# Patient Record
Sex: Male | Born: 1970 | Race: Black or African American | Hispanic: No | Marital: Married | Smoking: Never smoker
Health system: Southern US, Community
[De-identification: ages and names within clinical notes are randomized; demographics above are authoritative.]

## PROBLEM LIST (undated history)

## (undated) DIAGNOSIS — K219 Gastro-esophageal reflux disease without esophagitis: Secondary | ICD-10-CM

## (undated) DIAGNOSIS — L259 Unspecified contact dermatitis, unspecified cause: Secondary | ICD-10-CM

## (undated) DIAGNOSIS — I10 Essential (primary) hypertension: Secondary | ICD-10-CM

## (undated) DIAGNOSIS — J069 Acute upper respiratory infection, unspecified: Secondary | ICD-10-CM

## (undated) DIAGNOSIS — G4730 Sleep apnea, unspecified: Secondary | ICD-10-CM

## (undated) DIAGNOSIS — F419 Anxiety disorder, unspecified: Secondary | ICD-10-CM

## (undated) DIAGNOSIS — D509 Iron deficiency anemia, unspecified: Secondary | ICD-10-CM

## (undated) DIAGNOSIS — K5090 Crohn's disease, unspecified, without complications: Secondary | ICD-10-CM

## (undated) DIAGNOSIS — J302 Other seasonal allergic rhinitis: Secondary | ICD-10-CM

## (undated) DIAGNOSIS — K432 Incisional hernia without obstruction or gangrene: Secondary | ICD-10-CM

## (undated) DIAGNOSIS — J309 Allergic rhinitis, unspecified: Secondary | ICD-10-CM

## (undated) DIAGNOSIS — Z87442 Personal history of urinary calculi: Secondary | ICD-10-CM

## (undated) HISTORY — DX: Allergic rhinitis, unspecified: J30.9

## (undated) HISTORY — DX: Other seasonal allergic rhinitis: J30.2

## (undated) HISTORY — DX: Gastro-esophageal reflux disease without esophagitis: K21.9

## (undated) HISTORY — DX: Unspecified contact dermatitis, unspecified cause: L25.9

## (undated) HISTORY — DX: Incisional hernia without obstruction or gangrene: K43.2

## (undated) HISTORY — DX: Acute upper respiratory infection, unspecified: J06.9

## (undated) HISTORY — DX: Anxiety disorder, unspecified: F41.9

## (undated) HISTORY — DX: Sleep apnea, unspecified: G47.30

## (undated) HISTORY — DX: Essential (primary) hypertension: I10

## (undated) HISTORY — PX: COLONOSCOPY: SHX174

## (undated) HISTORY — DX: Iron deficiency anemia, unspecified: D50.9

## (undated) HISTORY — DX: Crohn's disease, unspecified, without complications: K50.90

---

## 1996-03-09 HISTORY — PX: OTHER SURGICAL HISTORY: SHX169

## 2004-01-18 ENCOUNTER — Ambulatory Visit: Payer: Self-pay | Admitting: Internal Medicine

## 2007-01-04 ENCOUNTER — Encounter: Payer: Self-pay | Admitting: *Deleted

## 2007-01-04 DIAGNOSIS — I10 Essential (primary) hypertension: Secondary | ICD-10-CM

## 2007-01-04 DIAGNOSIS — K5090 Crohn's disease, unspecified, without complications: Secondary | ICD-10-CM

## 2007-01-04 HISTORY — DX: Essential (primary) hypertension: I10

## 2007-01-04 HISTORY — DX: Crohn's disease, unspecified, without complications: K50.90

## 2007-03-18 ENCOUNTER — Ambulatory Visit: Payer: Self-pay | Admitting: Internal Medicine

## 2007-03-18 DIAGNOSIS — J309 Allergic rhinitis, unspecified: Secondary | ICD-10-CM

## 2007-03-18 DIAGNOSIS — D509 Iron deficiency anemia, unspecified: Secondary | ICD-10-CM

## 2007-03-18 DIAGNOSIS — J302 Other seasonal allergic rhinitis: Secondary | ICD-10-CM | POA: Insufficient documentation

## 2007-03-18 HISTORY — DX: Allergic rhinitis, unspecified: J30.9

## 2007-03-18 HISTORY — DX: Iron deficiency anemia, unspecified: D50.9

## 2007-03-20 LAB — CONVERTED CEMR LAB
AST: 22 units/L (ref 0–37)
Albumin: 3.6 g/dL (ref 3.5–5.2)
Alkaline Phosphatase: 60 units/L (ref 39–117)
BUN: 10 mg/dL (ref 6–23)
Bacteria, UA: NEGATIVE
Basophils Relative: 0.7 % (ref 0.0–1.0)
Calcium: 9.1 mg/dL (ref 8.4–10.5)
Chloride: 104 meq/L (ref 96–112)
Creatinine, Ser: 1.1 mg/dL (ref 0.4–1.5)
Crystals: NEGATIVE
Eosinophils Relative: 1.6 % (ref 0.0–5.0)
Folate: 10.4 ng/mL
GFR calc non Af Amer: 81 mL/min
Lymphocytes Relative: 26.1 % (ref 12.0–46.0)
Mucus, UA: NEGATIVE
Neutro Abs: 5 10*3/uL (ref 1.4–7.7)
Nitrite: NEGATIVE
Platelets: 306 10*3/uL (ref 150–400)
RBC / HPF: NONE SEEN
RBC: 5.24 M/uL (ref 4.22–5.81)
RDW: 14.9 % — ABNORMAL HIGH (ref 11.5–14.6)
Specific Gravity, Urine: 1.02 (ref 1.000–1.03)
Squamous Epithelial / LPF: NEGATIVE /lpf
TSH: 0.8 microintl units/mL (ref 0.35–5.50)
Total Bilirubin: 0.7 mg/dL (ref 0.3–1.2)
Total CHOL/HDL Ratio: 3.7
Total Protein, Urine: NEGATIVE mg/dL
Urine Glucose: NEGATIVE mg/dL
WBC: 8 10*3/uL (ref 4.5–10.5)

## 2007-04-22 ENCOUNTER — Ambulatory Visit: Payer: Self-pay | Admitting: Internal Medicine

## 2007-05-20 ENCOUNTER — Ambulatory Visit: Payer: Self-pay | Admitting: Internal Medicine

## 2007-06-24 ENCOUNTER — Ambulatory Visit: Payer: Self-pay | Admitting: Internal Medicine

## 2007-07-22 ENCOUNTER — Ambulatory Visit: Payer: Self-pay | Admitting: Internal Medicine

## 2007-08-19 ENCOUNTER — Ambulatory Visit: Payer: Self-pay | Admitting: Internal Medicine

## 2007-12-09 ENCOUNTER — Ambulatory Visit: Payer: Self-pay | Admitting: Internal Medicine

## 2007-12-09 DIAGNOSIS — L219 Seborrheic dermatitis, unspecified: Secondary | ICD-10-CM | POA: Insufficient documentation

## 2007-12-09 DIAGNOSIS — J069 Acute upper respiratory infection, unspecified: Secondary | ICD-10-CM | POA: Insufficient documentation

## 2007-12-09 DIAGNOSIS — L259 Unspecified contact dermatitis, unspecified cause: Secondary | ICD-10-CM

## 2007-12-09 HISTORY — DX: Acute upper respiratory infection, unspecified: J06.9

## 2007-12-09 HISTORY — DX: Unspecified contact dermatitis, unspecified cause: L25.9

## 2008-01-13 ENCOUNTER — Ambulatory Visit: Payer: Self-pay | Admitting: Internal Medicine

## 2009-05-13 ENCOUNTER — Ambulatory Visit: Payer: Self-pay | Admitting: Internal Medicine

## 2010-04-10 NOTE — Assessment & Plan Note (Signed)
Summary: SORE THROAT/NWS  #   Vital Signs:  Patient profile:   40 year old male Height:      64 inches Weight:      159.50 pounds BMI:     27.48 O2 Sat:      99 % on Room air Temp:     97.4 degrees F oral Pulse rate:   95 / minute BP sitting:   152 / 92  (left arm) Cuff size:   regular  Vitals Entered ByZella Ball Ewing (May 13, 2009 11:31 AM)  O2 Flow:  Room air CC: Sore throat/RE   CC:  Sore throat/RE.  History of Present Illness: here with severe ST for 3 days, with fever, weakness, headache, myalgias and malaise;  no signficant cough and Pt denies CP, sob, doe, wheezing, orthopnea, pnd, worsening LE edema, palps, dizziness or syncope   Also would like to consider change of the lotrel as taking it seems to cause him to become in a "daze."  Pt denies CP, sob, doe, wheezing, orthopnea, pnd, worsening LE edema, palps, dizziness or syncope  Pt denies new neuro symptoms such as headache, facial or extremity weakness   Overall good compliance with meds.    Problems Prior to Update: 1)  Pharyngitis-acute  (ICD-462) 2)  Dermatitis, Scalp  (ICD-692.9) 3)  Uri  (ICD-465.9) 4)  Preventive Health Care  (ICD-V70.0) 5)  Allergic Rhinitis  (ICD-477.9) 6)  Anemia-iron Deficiency  (ICD-280.9) 7)  Hypertension  (ICD-401.9) 8)  Crohn's Disease  (ICD-555.9)  Medications Prior to Update: 1)  Cyanocobalamin 1000 Mcg/ml Inj Soln (Cyanocobalamin) .Marland Kitchen.. 1 Injection Q Mon. 2)  Amlodipine Besy-Benazepril Hcl 5-20 Mg Caps (Amlodipine Besy-Benazepril Hcl) .Marland Kitchen.. 1 By Mouth Once Daily  Current Medications (verified): 1)  Cyanocobalamin 1000 Mcg/ml Inj Soln (Cyanocobalamin) .Marland Kitchen.. 1 Injection Q Mon. 2)  Losartan Potassium 100 Mg Tabs (Losartan Potassium) .Marland Kitchen.. 1 By Mouth Once Daily 3)  Clarithromycin 500 Mg Tabs (Clarithromycin) .Marland Kitchen.. 1po Two Times A Day  Allergies (verified): 1)  ! Zantac  Past History:  Past Medical History: Last updated: 03/18/2007 HYPERTENSION (ICD-401.9) CROHN'S DISEASE  (ICD-555.9) Anemia-iron deficiency B12 deficiency Allergic rhinitis  Past Surgical History: Last updated: 03/18/2007 partial colon removal/1998 - right hemicolectomy  Social History: Last updated: 03/18/2007 Never Smoked Alcohol use-yes 1 child work -admin asst in [place]  Risk Factors: Smoking Status: never (03/18/2007)  Review of Systems       all otherwise negative per pt -    Physical Exam  General:  alert and well-developed.  , mild ill  Head:  normocephalic and atraumatic.   Eyes:  vision grossly intact, pupils equal, and pupils round.   Ears:  bilat tm;s red, sinus nontender Nose:  nasal dischargemucosal pallor and mucosal edema.   Mouth:  pharyngeal erythema and fair dentition.   Neck:  supple and cervical lymphadenopathy.   Lungs:  normal respiratory effort and normal breath sounds.   Heart:  normal rate and regular rhythm.   Extremities:  no edema, no erythema    Impression & Recommendations:  Problem # 1:  PHARYNGITIS-ACUTE (ICD-462)  His updated medication list for this problem includes:    Clarithromycin 500 Mg Tabs (Clarithromycin) .Marland Kitchen... 1po two times a day treat as above, f/u any worsening signs or symptoms   Problem # 2:  HYPERTENSION (ICD-401.9)  His updated medication list for this problem includes:    Losartan Potassium 100 Mg Tabs (Losartan potassium) .Marland Kitchen... 1 by mouth once daily states the generic lotrel  makes him feel 'dazed" but hard to be more specific; will change to generic cozaar; f/u next visit  Complete Medication List: 1)  Cyanocobalamin 1000 Mcg/ml Inj Soln (Cyanocobalamin) .Marland Kitchen.. 1 injection q mon. 2)  Losartan Potassium 100 Mg Tabs (Losartan potassium) .Marland Kitchen.. 1 by mouth once daily 3)  Clarithromycin 500 Mg Tabs (Clarithromycin) .Marland Kitchen.. 1po two times a day  Patient Instructions: 1)  stop the amlodipine/benazepril 2)  start the generic cozaar (losartan) 10p0 mg per day for blood pressure 3)  also Please take all new medications as  prescribed   - the antibiotic 4)  Please schedule a follow-up appointment in 2 months with CPX labs Prescriptions: CLARITHROMYCIN 500 MG TABS (CLARITHROMYCIN) 1po two times a day  #20 x 0   Entered and Authorized by:   Corwin Levins MD   Signed by:   Corwin Levins MD on 05/13/2009   Method used:   Print then Give to Patient   RxID:   [id number] LOSARTAN POTASSIUM 100 MG TABS (LOSARTAN POTASSIUM) 1 by mouth once daily  #30 x 11   Entered and Authorized by:   Corwin Levins MD   Signed by:   Corwin Levins MD on 05/13/2009   Method used:   Print then Give to Patient   RxID:   [id number]

## 2010-04-10 NOTE — Letter (Signed)
Summary: Out of Work  [facility]  [address] [place], [place] [address]   Phone: [phone]  Fax: [phone]    May 13, 2009   Employee:  FORRESTER BLANDO    To Whom It May Concern:   For Medical reasons, please excuse the above named employee from work for the following dates:  Start:   May 09, 2009  End:   May 14, 2009  -    to return to work May 15, 2009   If you need additional information, please feel free to contact our office.         Sincerely,    Corwin Levins MD

## 2010-06-11 ENCOUNTER — Other Ambulatory Visit: Payer: Self-pay

## 2010-06-16 ENCOUNTER — Other Ambulatory Visit (INDEPENDENT_AMBULATORY_CARE_PROVIDER_SITE_OTHER): Payer: BLUE CROSS/BLUE SHIELD

## 2010-06-16 DIAGNOSIS — Z0000 Encounter for general adult medical examination without abnormal findings: Secondary | ICD-10-CM

## 2010-06-16 LAB — BASIC METABOLIC PANEL
BUN: 11 mg/dL (ref 6–23)
Chloride: 101 mEq/L (ref 96–112)
Potassium: 4.6 mEq/L (ref 3.5–5.1)

## 2010-06-16 LAB — CBC WITH DIFFERENTIAL/PLATELET
Basophils Relative: 0.6 % (ref 0.0–3.0)
Eosinophils Relative: 4.7 % (ref 0.0–5.0)
HCT: 38.5 % — ABNORMAL LOW (ref 39.0–52.0)
MCV: 70 fl — ABNORMAL LOW (ref 78.0–100.0)
Monocytes Absolute: 0.7 10*3/uL (ref 0.1–1.0)
Monocytes Relative: 8.8 % (ref 3.0–12.0)
Neutrophils Relative %: 54 % (ref 43.0–77.0)
RBC: 5.49 Mil/uL (ref 4.22–5.81)
WBC: 7.5 10*3/uL (ref 4.5–10.5)

## 2010-06-16 LAB — LIPID PANEL
LDL Cholesterol: 93 mg/dL (ref 0–99)
Total CHOL/HDL Ratio: 4

## 2010-06-16 LAB — URINALYSIS
Leukocytes, UA: NEGATIVE
Nitrite: NEGATIVE
Urobilinogen, UA: 0.2 (ref 0.0–1.0)

## 2010-06-16 LAB — HEPATIC FUNCTION PANEL
ALT: 23 U/L (ref 0–53)
AST: 23 U/L (ref 0–37)
Bilirubin, Direct: 0.1 mg/dL (ref 0.0–0.3)
Total Bilirubin: 0.5 mg/dL (ref 0.3–1.2)

## 2010-06-18 ENCOUNTER — Encounter: Payer: Self-pay | Admitting: Internal Medicine

## 2010-06-18 ENCOUNTER — Ambulatory Visit (INDEPENDENT_AMBULATORY_CARE_PROVIDER_SITE_OTHER): Payer: BLUE CROSS/BLUE SHIELD | Admitting: Internal Medicine

## 2010-06-18 VITALS — BP 170/102 | HR 75 | Temp 98.3°F | Ht 64.0 in | Wt 168.0 lb

## 2010-06-18 DIAGNOSIS — I10 Essential (primary) hypertension: Secondary | ICD-10-CM

## 2010-06-18 DIAGNOSIS — E049 Nontoxic goiter, unspecified: Secondary | ICD-10-CM

## 2010-06-18 DIAGNOSIS — Z0001 Encounter for general adult medical examination with abnormal findings: Secondary | ICD-10-CM | POA: Insufficient documentation

## 2010-06-18 DIAGNOSIS — J309 Allergic rhinitis, unspecified: Secondary | ICD-10-CM

## 2010-06-18 DIAGNOSIS — G4710 Hypersomnia, unspecified: Secondary | ICD-10-CM

## 2010-06-18 DIAGNOSIS — K432 Incisional hernia without obstruction or gangrene: Secondary | ICD-10-CM | POA: Insufficient documentation

## 2010-06-18 DIAGNOSIS — Z0000 Encounter for general adult medical examination without abnormal findings: Secondary | ICD-10-CM

## 2010-06-18 HISTORY — DX: Incisional hernia without obstruction or gangrene: K43.2

## 2010-06-18 MED ORDER — FLUTICASONE PROPIONATE 50 MCG/ACT NA SUSP: 2.0000 | Freq: Every day | NASAL | Status: DC

## 2010-06-18 MED ORDER — METHYLPREDNISOLONE ACETATE 80 MG/ML IJ SUSP
120.0000 mg | Freq: Once | INTRAMUSCULAR | Status: AC
  Administered 2010-06-18: 120 mg via INTRAMUSCULAR

## 2010-06-18 MED ORDER — OLMESARTAN MEDOXOMIL 40 MG PO TABS: 40.0000 mg | ORAL_TABLET | Freq: Every day | ORAL | Status: DC

## 2010-06-18 MED ORDER — AMLODIPINE BESYLATE 5 MG PO TABS: 5.0000 mg | ORAL_TABLET | Freq: Every day | ORAL | Status: DC

## 2010-06-18 MED ORDER — FEXOFENADINE HCL 180 MG PO TABS: 180.0000 mg | ORAL_TABLET | Freq: Every day | ORAL | Status: DC

## 2010-06-18 NOTE — Assessment & Plan Note (Signed)
Mild symptom once per quarter, denies significant pain, d/w pt nature of problem, will cont to monitor for now, does not want surgury referral at this time, warned to go to ER immediately for any sustained pain/swelling

## 2010-06-18 NOTE — Assessment & Plan Note (Signed)
New, prob multinodular, for thyroid u/s, TSH noted normal

## 2010-06-18 NOTE — Patient Instructions (Addendum)
Take all new medications as prescribed - the benicar and the amlodipine for blood pressure (ok to continue the losartan until you have the benicar, then stop); as well as the allegra and flonase You had the steroid shot today Continue all other medications as before You will be contacted regarding the referral for: thyroid ultrasound, and pulmonary referral Please return in 2 months Please monitor your Blood Pressure at home, or at a local drug store on a regular basis; your goal is to be < 140/90

## 2010-06-18 NOTE — Assessment & Plan Note (Signed)
Moderate symptoms, for depomedrol IM today, and seasonal use of allegra/flonase as directed, consider allergy referral

## 2010-06-18 NOTE — Assessment & Plan Note (Addendum)
Uncontrolled, likely needs dual tx, may be resistant due to increasing wt, salt in diet, and/or possible osa as well;  Will change losartan 100 mg to the benicar 40 mg as this is more effective, and add amlodipine 5 mg, to f/u BP at home and next visit

## 2010-06-18 NOTE — Assessment & Plan Note (Signed)

## 2010-06-18 NOTE — Assessment & Plan Note (Addendum)
Mild to mod, worse with increased wt, and possbily nasal allergy symtpoms as well - for pulm referral to r/o need for sleep study, encouraged wt loss with diet and excercise

## 2010-06-18 NOTE — Progress Notes (Signed)
Subjective:    Patient ID: Bradley Donaldson, male    DOB: 12-Oct-1970, 40 y.o.   MRN: [id number]  HPI Here for wellness and f/u;  Overall doing ok;  Pt denies CP, worsening SOB, DOE, wheezing, orthopnea, PND, worsening LE edema, palpitations, dizziness or syncope.  Pt denies neurological change such as new Headache, facial or extremity weakness.  Pt denies polydipsia, polyuria, or low sugar symptoms. Pt states overall good compliance with treatment and medications, good tolerability, and trying to follow lower cholesterol diet.  Pt denies worsening depressive symptoms, suicidal ideation or panic. No fever, wt loss, night sweats, loss of appetite, or other constitutional symptoms.  Pt states good ability with ADL's, low fall risk, home safety reviewed and adequate, no significant changes in hearing or vision, and occasionally active with exercise.  Does have symptoms related to his firner incision site to mid right abd  Such that every 3-4 mo or so with cough or sneeze he will have a bulge to the incision that he can push back in, or goes down with lying down as well.  No pain, n/v, bowel change, wt loss or blood.  Also with daytime somnolence many days assoc with marked snoring and recent wt gain.  Also with 4 wks onset nasal allergy symtpoms moderate to severe with post nasal gtt and cough, without color, pain or fever.  Denies hyper or hypo thyroid symptoms such as voice, skin or hair change.  Overall good compliance with treatment, and good medicine tolerability, including the losartan.   Past Medical History  Diagnosis Date  . ANEMIA-IRON DEFICIENCY 03/18/2007  . HYPERTENSION 01/04/2007  . URI 12/09/2007  . ALLERGIC RHINITIS 03/18/2007  . CROHN'S DISEASE 01/04/2007  . DERMATITIS, SCALP 12/09/2007  . Incisional hernia 06/18/2010   Past Surgical History  Procedure Date  . Partial colon removal 1998    Right hemicolectomy    reports that he has never smoked. He does not have any smokeless tobacco history  on file. He reports that he drinks alcohol. His drug history not on file. family history includes Hypertension in his other. Allergies  Allergen Reactions  . Ranitidine Hcl    Current Outpatient Prescriptions on File Prior to Visit  Medication Sig Dispense Refill  . DISCONTD: losartan (COZAAR) 100 MG tablet Take 100 mg by mouth daily.        . cyanocobalamin (,VITAMIN B-12,) 1000 MCG/ML injection Inject 1,000 mcg into the muscle every 30 (thirty) days.        Marland Kitchen DISCONTD: clarithromycin (BIAXIN) 500 MG tablet Take 500 mg by mouth 2 (two) times daily.         No current facility-administered medications on file prior to visit.   Review of Systems Review of Systems  Constitutional: Negative for diaphoresis, activity change, appetite change and unexpected weight change.  HENT: Negative for hearing loss, ear pain, facial swelling, mouth sores and neck stiffness.   Eyes: Negative for pain, redness and visual disturbance.  Respiratory: Negative for shortness of breath and wheezing.   Cardiovascular: Negative for chest pain and palpitations.  Gastrointestinal: Negative for diarrhea, blood in stool, abdominal distention and rectal pain.  Genitourinary: Negative for hematuria, flank pain and decreased urine volume.  Musculoskeletal: Negative for myalgias and joint swelling.  Skin: Negative for color change and wound.  Neurological: Negative for syncope and numbness.  Hematological: Negative for adenopathy.  Psychiatric/Behavioral: Negative for hallucinations, self-injury, decreased concentration and agitation.      Objective:   Physical ExamBP 170/102  Pulse 75  Temp(Src) 98.3 F (36.8 C) (Oral)  Ht 5\' 4"  (1.626 m)  Wt 168 lb (76.204 kg)  BMI 28.84 kg/m2  SpO2 97% Physical Exam  VS noted Constitutional: Pt is oriented to person, place, and time. Appears well-developed and well-nourished.  HENT:  Head: Normocephalic and atraumatic.  Right Ear: External ear normal.  Left Ear: External  ear normal.  Nose: Nose normal.  Nasal passages congested Mouth/Throat: Oropharynx is clear and moist.  Eyes: Conjunctivae and EOM are normal. Pupils are equal, round, and reactive to light.  Neck: Normal range of motion. Neck supple. No JVD present. No tracheal deviation present. has small goiter with prob several nodules, slightly larger to the right Cardiovascular: Normal rate, regular rhythm, normal heart sounds and intact distal pulses.   Pulmonary/Chest: Effort normal and breath sounds normal.  Abdominal: Soft. Bowel sounds are normal. There is no tenderness.  Musculoskeletal: Normal range of motion. Exhibits no edema.  Lymphadenopathy:  Has no cervical adenopathy.  Neurological: Pt is alert and oriented to person, place, and time. Pt has normal reflexes. No cranial nerve deficit.  Skin: Skin is warm and dry. No rash noted.  Psychiatric:  Has  normal mood and affect. Behavior is normal.          Assessment & Plan:

## 2010-06-23 ENCOUNTER — Ambulatory Visit
Admission: RE | Admit: 2010-06-23 | Discharge: 2010-06-23 | Disposition: A | Discharge status: Home / Self Care | Payer: BLUE CROSS/BLUE SHIELD | Source: Ambulatory Visit | Attending: Internal Medicine | Admitting: Internal Medicine

## 2010-06-23 DIAGNOSIS — E049 Nontoxic goiter, unspecified: Secondary | ICD-10-CM

## 2010-06-23 NOTE — Progress Notes (Signed)
Quick Note:  Voice message left on [employer] system - lab is negative, normal or otherwise stable, pt to continue same tx ______ 

## 2010-07-09 ENCOUNTER — Encounter: Payer: Self-pay | Admitting: Pulmonary Disease

## 2010-07-11 ENCOUNTER — Institutional Professional Consult (permissible substitution): Payer: BLUE CROSS/BLUE SHIELD | Admitting: Pulmonary Disease

## 2010-07-22 ENCOUNTER — Encounter: Payer: Self-pay | Admitting: Pulmonary Disease

## 2010-07-25 ENCOUNTER — Institutional Professional Consult (permissible substitution): Payer: BLUE CROSS/BLUE SHIELD | Admitting: Pulmonary Disease

## 2010-08-20 ENCOUNTER — Ambulatory Visit: Payer: BLUE CROSS/BLUE SHIELD | Admitting: Internal Medicine

## 2010-08-28 ENCOUNTER — Encounter: Payer: Self-pay | Admitting: Pulmonary Disease

## 2010-08-28 ENCOUNTER — Ambulatory Visit (INDEPENDENT_AMBULATORY_CARE_PROVIDER_SITE_OTHER): Payer: BC Managed Care – PPO | Admitting: Pulmonary Disease

## 2010-08-28 VITALS — BP 148/84 | HR 90 | Temp 98.5°F | Ht 65.0 in | Wt 174.8 lb

## 2010-08-28 DIAGNOSIS — G4733 Obstructive sleep apnea (adult) (pediatric): Secondary | ICD-10-CM | POA: Insufficient documentation

## 2010-08-28 NOTE — Assessment & Plan Note (Signed)
The pt's history is very suggestive of sleep disordered breathing, and he has significant abnormalities of his upper airway as well. I have had a long discussion with the pt about sleep apnea, including its impact on QOL and CV health.  I think he needs to have a sleep study for diagnosis, and the pt is agreeable.

## 2010-08-28 NOTE — Patient Instructions (Signed)
Will set up for home sleep testing.  Will arrange followup once results are available.  Work on weight reduction.

## 2010-08-28 NOTE — Progress Notes (Signed)
  Subjective:    Patient ID: Bradley Donaldson, male    DOB: 1970-04-24, 40 y.o.   MRN: [id number]  HPI The pt is a 40y/o male who I have been asked to see for possible osa.  He has underlying HTN that is difficult to control, and the question has been raised whether he may have sleep apnea.  His history is significant for: -loud snoring and abnormal breathing pattern during sleep.   -frequent awakenings at night, and rested only 50% of time upon awakening.  -definite sleep pressure during the day with inactivity, and feels his alertness is abnormal. -some dozing in the evenings with movies/tv, but no sleepiness while driving. -weight up about 5 pounds over the last 81yrs, and epworth score 13.  Sleep Questionnaire: What time do you typically go to bed?( Between what hours) 11pm to 1 am How long does it take you to fall asleep? 5 to 10 mins How many times during the night do you wake up? 4 What time do you get out of bed to start your day? 0800 Do you drive or operate heavy machinery in your occupation? No How much has your weight changed (up or down) over the past two years? (In pounds) 5 lb (2.268 kg) Have you ever had a sleep study before? No Do you currently use CPAP? No Do you wear oxygen at any time? No     Review of Systems  Constitutional: Negative for fever and unexpected weight change.  HENT: Negative for ear pain, nosebleeds, congestion, sore throat, rhinorrhea, sneezing, trouble swallowing, dental problem, postnasal drip and sinus pressure.   Eyes: Negative for redness and itching.  Respiratory: Negative for cough, chest tightness, shortness of breath and wheezing.   Cardiovascular: Negative for palpitations and leg swelling.  Gastrointestinal: Negative for nausea and vomiting.  Genitourinary: Negative for dysuria.  Musculoskeletal: Negative for joint swelling.  Skin: Negative for rash.  Neurological: Negative for headaches.  Hematological: Does not bruise/bleed easily.    Psychiatric/Behavioral: Negative for dysphoric mood. The patient is not nervous/anxious.        Objective:   Physical Exam Constitutional:  Well developed, no acute distress  HENT:  Nares patent without discharge, deviated septum to left with narrowing  Oropharynx without exudate, palate and uvula are elongated.  Significant soft tissue redundancy  Small posterior pharyngeal space.   Eyes:  Perrla, eomi, no scleral icterus  Neck:  No JVD, no TMG.  Excessive soft tissue.  Cardiovascular:  Normal rate, regular rhythm, no rubs or gallops.  No murmurs        Intact distal pulses  Pulmonary :  Normal breath sounds, no stridor or respiratory distress   No rales, rhonchi, or wheezing  Abdominal:  Soft, nondistended, bowel sounds present.  No tenderness noted.   Musculoskeletal:  No lower extremity edema noted.  Lymph Nodes:  No cervical lymphadenopathy noted  Skin:  No cyanosis noted  Neurologic:  Appears mildly sleepy, appropriate, moves all 4 extremities without obvious deficit.         Assessment & Plan:

## 2010-09-02 ENCOUNTER — Other Ambulatory Visit: Payer: Self-pay | Admitting: *Deleted

## 2010-09-02 NOTE — Telephone Encounter (Signed)
PA requested for Benicar Tablet PA received [08/29/10] and faxed to pharmacy.

## 2010-09-23 ENCOUNTER — Ambulatory Visit (INDEPENDENT_AMBULATORY_CARE_PROVIDER_SITE_OTHER): Payer: BC Managed Care – PPO | Admitting: Pulmonary Disease

## 2010-09-23 DIAGNOSIS — G4733 Obstructive sleep apnea (adult) (pediatric): Secondary | ICD-10-CM

## 2010-09-26 ENCOUNTER — Ambulatory Visit (INDEPENDENT_AMBULATORY_CARE_PROVIDER_SITE_OTHER): Payer: BC Managed Care – PPO | Admitting: Internal Medicine

## 2010-09-26 ENCOUNTER — Telehealth: Payer: Self-pay | Admitting: Pulmonary Disease

## 2010-09-26 ENCOUNTER — Encounter: Payer: Self-pay | Admitting: Internal Medicine

## 2010-09-26 VITALS — BP 144/100 | HR 91 | Temp 98.9°F | Ht 65.0 in | Wt 172.5 lb

## 2010-09-26 DIAGNOSIS — D509 Iron deficiency anemia, unspecified: Secondary | ICD-10-CM

## 2010-09-26 DIAGNOSIS — J309 Allergic rhinitis, unspecified: Secondary | ICD-10-CM

## 2010-09-26 DIAGNOSIS — I10 Essential (primary) hypertension: Secondary | ICD-10-CM

## 2010-09-26 DIAGNOSIS — L259 Unspecified contact dermatitis, unspecified cause: Secondary | ICD-10-CM

## 2010-09-26 MED ORDER — AMLODIPINE BESYLATE 5 MG PO TABS: 5.0000 mg | ORAL_TABLET | Freq: Every day | ORAL | Status: DC

## 2010-09-26 MED ORDER — CYANOCOBALAMIN 1000 MCG/ML IJ SOLN
1000.0000 ug | Freq: Once | INTRAMUSCULAR | Status: AC
  Administered 2010-09-26: 1000 ug via INTRAMUSCULAR

## 2010-09-26 MED ORDER — OLMESARTAN MEDOXOMIL 40 MG PO TABS: 40.0000 mg | ORAL_TABLET | Freq: Every day | ORAL | Status: DC

## 2010-09-26 NOTE — Patient Instructions (Addendum)
Take all new medications as prescribed - the discount card and samples benicar 40 mg Please continue the amlodipine 5 mg - remember this medication is $5 CASH per month at [facility], If that might be an option for you Continue all other medications as before Please call in 2 wks if you just cant seem to get the Benicar situation to work out, as we can go back to the losartan You will be contacted regarding the referral for: dermatology referral Please return in 4 months, or sooner if needed

## 2010-09-26 NOTE — Telephone Encounter (Signed)
Pt needs ov to go over sleep study.  Let him know was abnormal

## 2010-09-26 NOTE — Assessment & Plan Note (Signed)
The pt has moderate osa by his recent sleep study, and will need ov to discuss various treatment options.

## 2010-09-26 NOTE — Progress Notes (Signed)
The pt underwent home sleep testing with a type 3 portable monitoring device.  Airflow, effort, oxygen saturation, and pulse rate were all monitored during the night.  The pt's raw data and tracings have been reviewed with the following findings:  1) flow evaluation period of 3 hrs and 2) 79 apneas and 16 hypopneas were noted, with an AHI 25/hr 3) lowest oxygen saturation was 72%, and the pt spent only the entire night less than or equal to 88% saturation.

## 2010-09-26 NOTE — Assessment & Plan Note (Addendum)
Uncontrolled , poss exac by OSA not yet treated, most recent testing results pending;  Has not yet started the benicar due to insurance and cost, will give the discount copay card, and new rx for [facility] to use,  Also norvasc is $5/mo at [facility] - to cont this as well;  Pt to call for any symtpoms of weakness/dizzy with lower BP; consider re-start the losartan if unable to get the benicar

## 2010-09-27 ENCOUNTER — Encounter: Payer: Self-pay | Admitting: Internal Medicine

## 2010-09-27 NOTE — Assessment & Plan Note (Signed)
Mild, for OTC allegra prn,  to f/u any worsening symptoms or concerns

## 2010-09-27 NOTE — Progress Notes (Signed)
  Subjective:    Patient ID: Bradley Donaldson, male    DOB: 04/17/1970, 40 y.o.   MRN: [id number]  HPI Her to f/u; overall doing ok but unfort could not get the benicar at all due to cost, so has only been taking the norvasc;  Has just undergone sleep study, results pending, but seems likely to have OSA.  For b12 today as well. Pt denies chest pain, increased sob or doe, wheezing, orthopnea, PND, increased LE swelling, palpitations, dizziness or syncope.  Pt denies new neurological symptoms such as new headache, or facial or extremity weakness or numbness.  Pt denies polydipsia, polyuria.  Does have several wks ongoing nasal allergy symptoms with clear congestion, itch and sneeze, without fever, pain, ST, cough or wheezing. Past Medical History  Diagnosis Date  . ANEMIA-IRON DEFICIENCY 03/18/2007  . HYPERTENSION 01/04/2007  . URI 12/09/2007  . ALLERGIC RHINITIS 03/18/2007  . CROHN'S DISEASE 01/04/2007  . DERMATITIS, SCALP 12/09/2007  . Incisional hernia 06/18/2010   Past Surgical History  Procedure Date  . Partial colon removal 1998    Right hemicolectomy    reports that he has never smoked. He does not have any smokeless tobacco history on file. He reports that he drinks alcohol. His drug history not on file. family history includes Heart disease in his father and Hypertension in his other. Allergies  Allergen Reactions  . Ranitidine Hcl    Current Outpatient Prescriptions on File Prior to Visit  Medication Sig Dispense Refill  . cyanocobalamin (,VITAMIN B-12,) 1000 MCG/ML injection Inject 1,000 mcg into the muscle every 30 (thirty) days.         Review of Systems Review of Systems  Constitutional: Negative for diaphoresis and unexpected weight change.  HENT: Negative for drooling and tinnitus.   Eyes: Negative for photophobia and visual disturbance.  Respiratory: Negative for choking and stridor.   Skin: Negative for color change and wound. except for the above Neurological: Negative for  tremors and numbness.  Psychiatric/Behavioral: Negative for decreased concentration. The patient is not hyperactive.       Objective:   Physical Exam BP 144/100  Pulse 91  Temp(Src) 98.9 F (37.2 C) (Oral)  Ht 5\' 5"  (1.651 m)  Wt 172 lb 8 oz (78.245 kg)  BMI 28.71 kg/m2  SpO2 97% Physical Exam  VS noted Constitutional: Pt appears well-developed and well-nourished.  HENT: Head: Normocephalic.  Right Ear: External ear normal.  Left Ear: External ear normal.  Eyes: Conjunctivae and EOM are normal. Pupils are equal, round, and reactive to light.  Neck: Normal range of motion. Neck supple.  Cardiovascular: Normal rate and regular rhythm.   Pulmonary/Chest: Effort normal and breath sounds normal.  Abd:  Soft, NT, non-distended, + BS Neurological: Pt is alert. No cranial nerve deficit.  Skin: Skin is warm. No erythema. but has marked scaliness, mild erythema scalp right frontal Psychiatric: Pt behavior is normal. Thought content normal. 1+ nervous        Assessment & Plan:

## 2010-09-27 NOTE — Assessment & Plan Note (Signed)
stable overall by hx and exam, most recent data reviewed with pt, and pt to continue medical treatment as before  Lab Results  Component Value Date   HGB 12.5* 06/16/2010

## 2010-09-27 NOTE — Assessment & Plan Note (Signed)
Mild, atypical, for derm referral,  to f/u any worsening symptoms or concerns

## 2010-09-29 ENCOUNTER — Telehealth: Payer: Self-pay | Admitting: Pulmonary Disease

## 2010-09-29 NOTE — Telephone Encounter (Signed)
Pt states he had home sleep study done last Monday and returned machine on Tuesday, wants to know results of same. I advised pt I will check with KC's nurse and give him a call back on tomorrow. Megan, I see were KC noted same is abnormal and needs follow up. Please advise if you have a slot held for pt. Thanks.

## 2010-10-01 NOTE — Telephone Encounter (Signed)
Called and spoke with pt's wife.  Pt scheduled to see [facility] 10/10/10 at 9:15 am.

## 2010-10-01 NOTE — Telephone Encounter (Signed)
LM with family member for pt to call back.

## 2010-10-01 NOTE — Telephone Encounter (Signed)
PT RETURNED CALL. ASKS THAT NURSE CALL HIS WIFE AT [phone]. Bradley Donaldson

## 2010-10-01 NOTE — Telephone Encounter (Signed)
This is a duplicate message.  Will sign off on this message.  See phone note from 7/20 for complete details.

## 2010-10-02 ENCOUNTER — Encounter: Payer: Self-pay | Admitting: Pulmonary Disease

## 2010-10-10 ENCOUNTER — Ambulatory Visit (INDEPENDENT_AMBULATORY_CARE_PROVIDER_SITE_OTHER): Payer: BC Managed Care – PPO | Admitting: Pulmonary Disease

## 2010-10-10 ENCOUNTER — Encounter: Payer: Self-pay | Admitting: Pulmonary Disease

## 2010-10-10 VITALS — BP 120/80 | HR 88 | Temp 98.5°F | Ht 65.0 in | Wt 173.0 lb

## 2010-10-10 DIAGNOSIS — G4733 Obstructive sleep apnea (adult) (pediatric): Secondary | ICD-10-CM

## 2010-10-10 NOTE — Assessment & Plan Note (Signed)
The pt has moderate osa by his sleep study, and clearly is symptomatic at night and during the day.  I discussed with him the various treatment options, including trial of weight loss alone, surgery , dental appliance, and cpap.  Given his symptoms and degree of sleep apnea, would recommend trying cpap first while working on modest weight loss.  He is agreeable. I will set the patient up on cpap at a moderate pressure level to allow for desensitization, and will troubleshoot the device over the next 4-6weeks if needed.  The pt is to call me if having issues with tolerance.  Will then optimize the pressure once patient is able to wear cpap on a consistent basis.

## 2010-10-10 NOTE — Progress Notes (Signed)
  Subjective:    Patient ID: Bradley Donaldson, male    DOB: July 03, 1970, 40 y.o.   MRN: [id number]  HPI The pt comes in today for f/u of his recent sleep study.  He was found to have moderate osa, with AHI 25/hr.  I have reviewed the study with him in detail, and answered all of his questions.    Review of Systems  Constitutional: Negative for fever and unexpected weight change.  HENT: Negative for ear pain, nosebleeds, congestion, sore throat, rhinorrhea, sneezing, trouble swallowing, dental problem, postnasal drip and sinus pressure.   Eyes: Negative for redness and itching.  Respiratory: Negative for cough, chest tightness, shortness of breath and wheezing.   Cardiovascular: Negative for palpitations and leg swelling.  Gastrointestinal: Negative for nausea and vomiting.  Genitourinary: Negative for dysuria.  Musculoskeletal: Negative for joint swelling.  Skin: Negative for rash.  Neurological: Negative for headaches.  Hematological: Does not bruise/bleed easily.  Psychiatric/Behavioral: Negative for dysphoric mood. The patient is not nervous/anxious.        Objective:   Physical Exam Ow male in nad Nares without discharge or purulence LE without edema, no cyanosis Awake, appears somewhat sleepy, moves all 4        Assessment & Plan:

## 2010-10-10 NOTE — Patient Instructions (Signed)
Will start on cpap.  Please call if having tolerance issues. Work on weight loss followup with me in 5 weeks.  

## 2010-11-14 ENCOUNTER — Ambulatory Visit: Payer: BC Managed Care – PPO | Admitting: Pulmonary Disease

## 2010-11-21 ENCOUNTER — Ambulatory Visit: Payer: BC Managed Care – PPO | Admitting: Pulmonary Disease

## 2010-12-25 ENCOUNTER — Encounter: Payer: Self-pay | Admitting: Pulmonary Disease

## 2010-12-25 ENCOUNTER — Ambulatory Visit (INDEPENDENT_AMBULATORY_CARE_PROVIDER_SITE_OTHER): Payer: BC Managed Care – PPO | Admitting: Pulmonary Disease

## 2010-12-25 VITALS — BP 156/88 | HR 98 | Temp 98.4°F | Ht 65.0 in | Wt 177.0 lb

## 2010-12-25 DIAGNOSIS — G4733 Obstructive sleep apnea (adult) (pediatric): Secondary | ICD-10-CM

## 2010-12-25 NOTE — Patient Instructions (Signed)
Will have your machine put on auto mode for the next 2-3 weeks, and will let you know your optimal pressure. Work on weight reduction Try the nasal gel pad, and make sure mask is not being pulled too tight.  If you continue to have issues with pressure sore on bridge of nose, may need to try different mask followup with me in 6mos, but call if having issues with cpap.

## 2010-12-25 NOTE — Progress Notes (Signed)
  Subjective:    Patient ID: Bradley Donaldson, male    DOB: [birth year], 40 y.o.   MRN: [id number]  HPI The patient comes in today for followup of his known moderate sleep apnea.  He has been wearing CPAP since the last visit, and has seen a big difference in his sleep and daytime alertness.  He is having no issues with pressure, but is having some issue with the mask.  He has an excoriated area over the bridge of his nose without skin breakdown.  He does not feel that he is pulling the mask too tight.  The patient has yet to have his pressure optimized.   Review of Systems  Constitutional: Negative for fever and unexpected weight change.  HENT: Negative for ear pain, nosebleeds, congestion, sore throat, rhinorrhea, sneezing, trouble swallowing, dental problem, postnasal drip and sinus pressure.   Eyes: Negative for redness and itching.  Respiratory: Negative for cough, chest tightness, shortness of breath and wheezing.   Cardiovascular: Negative for palpitations and leg swelling.  Gastrointestinal: Negative for nausea and vomiting.  Genitourinary: Negative for dysuria.  Musculoskeletal: Negative for joint swelling.  Skin: Negative for rash.  Neurological: Negative for headaches.  Hematological: Does not bruise/bleed easily.  Psychiatric/Behavioral: Negative for dysphoric mood. The patient is not nervous/anxious.        Objective:   Physical Exam Overweight male in no acute distress Mild skin breakdown over the bridge of the nose, but no necrosis. Oropharynx clear Chest is clear to auscultation Lower extremities without edema, no cyanosis noted Alert and oriented, does not appear to be sleepy, moves all 4 extremities.       Assessment & Plan:

## 2010-12-25 NOTE — Assessment & Plan Note (Signed)
The patient is doing fairly well with CPAP, and has seen a big difference in his sleep and daytime alertness.  He is having issues with pressure sores over the bridge of his nose, and I have asked him to make sure he is not pulling the mask too tight.  I have given him a silicon pad to put over the bridge of his nose to see if this helps.  If he continues to have issues, he may need to try a different CPAP mask.  We'll need to optimize his pressure for him, and I have encouraged him to work aggressively on weight loss. Care Plan:  At this point, will arrange for the patient's machine to be changed over to auto mode for 2 weeks to optimize their pressure.  I will review the downloaded data once sent by dme, and also evaluate for compliance, leaks, and residual osa.  I will call the patient and dme to discuss the results, and have the patient's machine set appropriately.  This will serve as the pt's cpap pressure titration.

## 2011-01-23 ENCOUNTER — Ambulatory Visit: Payer: BC Managed Care – PPO | Admitting: Internal Medicine

## 2011-04-05 ENCOUNTER — Other Ambulatory Visit: Payer: Self-pay | Admitting: Pulmonary Disease

## 2011-04-05 DIAGNOSIS — G4733 Obstructive sleep apnea (adult) (pediatric): Secondary | ICD-10-CM

## 2011-05-03 ENCOUNTER — Other Ambulatory Visit: Payer: Self-pay | Admitting: Pulmonary Disease

## 2011-05-03 DIAGNOSIS — G4733 Obstructive sleep apnea (adult) (pediatric): Secondary | ICD-10-CM

## 2011-06-25 ENCOUNTER — Ambulatory Visit: Payer: BC Managed Care – PPO | Admitting: Pulmonary Disease

## 2011-07-21 ENCOUNTER — Telehealth: Payer: Self-pay

## 2011-07-21 NOTE — Telephone Encounter (Signed)
Called the patient to inform leaving 8 boxes (samples, #7 each box, total#56) of Benicar 40 mg at front desk for the patient to pickup as patient is out of medication.  Informed will do PA for medication asap.

## 2011-07-28 ENCOUNTER — Telehealth: Payer: Self-pay

## 2011-07-28 NOTE — Telephone Encounter (Signed)
Called [employer] [facility] [place] at [phone] to start PA for Benicar 40 mg.  They sent form to be completed and signed by MD. Form is on MD's desk to sign and once completed will send back to [facility] to await approval

## 2011-11-09 ENCOUNTER — Other Ambulatory Visit: Payer: Self-pay | Admitting: Internal Medicine

## 2011-11-17 ENCOUNTER — Other Ambulatory Visit: Payer: Self-pay

## 2011-11-17 ENCOUNTER — Other Ambulatory Visit: Payer: Self-pay | Admitting: Internal Medicine

## 2011-11-17 DIAGNOSIS — I10 Essential (primary) hypertension: Secondary | ICD-10-CM

## 2011-11-17 MED ORDER — AMLODIPINE BESYLATE 5 MG PO TABS: 5.0000 mg | ORAL_TABLET | Freq: Every day | ORAL | Status: DC

## 2012-01-18 ENCOUNTER — Encounter: Payer: Self-pay | Admitting: Internal Medicine

## 2012-01-18 ENCOUNTER — Other Ambulatory Visit (INDEPENDENT_AMBULATORY_CARE_PROVIDER_SITE_OTHER): Payer: BC Managed Care – PPO

## 2012-01-18 ENCOUNTER — Ambulatory Visit (INDEPENDENT_AMBULATORY_CARE_PROVIDER_SITE_OTHER): Payer: BC Managed Care – PPO | Admitting: Internal Medicine

## 2012-01-18 VITALS — BP 130/80 | HR 74 | Temp 98.8°F | Ht 64.0 in | Wt 173.0 lb

## 2012-01-18 DIAGNOSIS — Z0000 Encounter for general adult medical examination without abnormal findings: Secondary | ICD-10-CM

## 2012-01-18 DIAGNOSIS — K5090 Crohn's disease, unspecified, without complications: Secondary | ICD-10-CM

## 2012-01-18 DIAGNOSIS — Z23 Encounter for immunization: Secondary | ICD-10-CM

## 2012-01-18 LAB — LIPID PANEL
Cholesterol: 154 mg/dL (ref 0–200)
Triglycerides: 108 mg/dL (ref 0.0–149.0)
VLDL: 21.6 mg/dL (ref 0.0–40.0)

## 2012-01-18 LAB — HEPATIC FUNCTION PANEL
ALT: 28 U/L (ref 0–53)
AST: 22 U/L (ref 0–37)
Albumin: 4.2 g/dL (ref 3.5–5.2)
Total Protein: 8 g/dL (ref 6.0–8.3)

## 2012-01-18 LAB — BASIC METABOLIC PANEL
BUN: 11 mg/dL (ref 6–23)
Chloride: 103 mEq/L (ref 96–112)
Glucose, Bld: 101 mg/dL — ABNORMAL HIGH (ref 70–99)
Potassium: 4.7 mEq/L (ref 3.5–5.1)

## 2012-01-18 LAB — URINALYSIS, ROUTINE W REFLEX MICROSCOPIC
Bilirubin Urine: NEGATIVE
Leukocytes, UA: NEGATIVE
Nitrite: NEGATIVE
Specific Gravity, Urine: >=1.03 (ref 1.000–1.030)
pH: 5.5 (ref 5.0–8.0)

## 2012-01-18 LAB — CBC WITH DIFFERENTIAL/PLATELET
Eosinophils Relative: 1.4 % (ref 0.0–5.0)
Lymphocytes Relative: 30 % (ref 12.0–46.0)
Monocytes Absolute: 0.7 10*3/uL (ref 0.1–1.0)
Monocytes Relative: 9.3 % (ref 3.0–12.0)
Neutrophils Relative %: 57.8 % (ref 43.0–77.0)
Platelets: 360 10*3/uL (ref 150.0–400.0)
WBC: 7.9 10*3/uL (ref 4.5–10.5)

## 2012-01-18 LAB — TSH: TSH: 0.86 u[IU]/mL (ref 0.35–5.50)

## 2012-01-18 MED ORDER — AMLODIPINE-OLMESARTAN 5-40 MG PO TABS: 1.0000 | ORAL_TABLET | Freq: Every day | ORAL | Status: DC

## 2012-01-18 NOTE — Assessment & Plan Note (Signed)

## 2012-01-18 NOTE — Patient Instructions (Addendum)
You had the flu shot today Continue all other medications as before You are given the samples of azor 5/40 mg, and the hardcopy prescription Please continue your efforts at being more active, low cholesterol diet, and weight control. You are otherwise up to date with prevention measures Please go to LAB in the Basement for the blood and/or urine tests to be done today You will be contacted by phone if any changes need to be made immediately.  Otherwise, you will receive a letter about your results with an explanation. Please remember to sign up for My Chart at your earliest convenience, as this will be important to you in the future with finding out test results. You will be contacted regarding the referral for: GI Please return in 1 year for your yearly visit, or sooner if needed, with Lab testing done 3-5 days before

## 2012-01-18 NOTE — Progress Notes (Signed)
Subjective:    Patient ID: Bradley Donaldson, male    DOB: 1970/03/30, 41 y.o.   MRN: [id number]  HPI  Here for wellness and f/u;  Overall doing ok;  Pt denies CP, worsening SOB, DOE, wheezing, orthopnea, PND, worsening LE edema, palpitations, dizziness or syncope.  Pt denies neurological change such as new Headache, facial or extremity weakness.  Pt denies polydipsia, polyuria, or low sugar symptoms. Pt states overall good compliance with treatment and medications, good tolerability, and trying to follow lower cholesterol diet.  Pt denies worsening depressive symptoms, suicidal ideation or panic. No fever, wt loss, night sweats, loss of appetite, or other constitutional symptoms.  Pt states good ability with ADL's, low fall risk, home safety reviewed and adequate, no significant changes in hearing or vision, and occasionally active with exercise.  No acute complaints.  Needs flu shot, med refills.  Is not currently established with GI for his crohns Past Medical History  Diagnosis Date  . ANEMIA-IRON DEFICIENCY 03/18/2007  . HYPERTENSION 01/04/2007  . URI 12/09/2007  . ALLERGIC RHINITIS 03/18/2007  . CROHN'S DISEASE 01/04/2007  . DERMATITIS, SCALP 12/09/2007  . Incisional hernia 06/18/2010   Past Surgical History  Procedure Date  . Partial colon removal 1998    Right hemicolectomy    reports that he has never smoked. He has never used smokeless tobacco. He reports that he drinks alcohol. His drug history not on file. family history includes Heart disease in his father and Hypertension in his other. Allergies  Allergen Reactions  . Ranitidine Hcl    Current Outpatient Prescriptions on File Prior to Visit  Medication Sig Dispense Refill  . cyanocobalamin (,VITAMIN B-12,) 1000 MCG/ML injection Inject 1,000 mcg into the muscle every 30 (thirty) days.        Marland Kitchen amLODipine-olmesartan (AZOR) 5-40 MG per tablet Take 1 tablet by mouth daily.  90 tablet  3   Review of Systems Review of Systems    Constitutional: Negative for diaphoresis, activity change, appetite change and unexpected weight change.  HENT: Negative for hearing loss, ear pain, facial swelling, mouth sores and neck stiffness.   Eyes: Negative for pain, redness and visual disturbance.  Respiratory: Negative for shortness of breath and wheezing.   Cardiovascular: Negative for chest pain and palpitations.  Gastrointestinal: Negative for diarrhea, blood in stool, abdominal distention and rectal pain.  Genitourinary: Negative for hematuria, flank pain and decreased urine volume.  Musculoskeletal: Negative for myalgias and joint swelling.  Skin: Negative for color change and wound.  Neurological: Negative for syncope and numbness.  Hematological: Negative for adenopathy.  Psychiatric/Behavioral: Negative for hallucinations, self-injury, decreased concentration and agitation.     Objective:   Physical Exam BP 130/80  Pulse 74  Temp 98.8 F (37.1 C) (Oral)  Ht 5\' 4"  (1.626 m)  Wt 173 lb (78.472 kg)  BMI 29.70 kg/m2  SpO2 98% Physical Exam  VS noted Constitutional: Pt is oriented to person, place, and time. Appears well-developed and well-nourished.  HENT:  Head: Normocephalic and atraumatic.  Right Ear: External ear normal.  Left Ear: External ear normal.  Nose: Nose normal.  Mouth/Throat: Oropharynx is clear and moist.  Eyes: Conjunctivae and EOM are normal. Pupils are equal, round, and reactive to light.  Neck: Normal range of motion. Neck supple. No JVD present. No tracheal deviation present.  Cardiovascular: Normal rate, regular rhythm, normal heart sounds and intact distal pulses.   Pulmonary/Chest: Effort normal and breath sounds normal.  Abdominal: Soft. Bowel sounds are normal. There  is no tenderness.  Musculoskeletal: Normal range of motion. Exhibits no edema.  Lymphadenopathy:  Has no cervical adenopathy.  Neurological: Pt is alert and oriented to person, place, and time. Pt has normal reflexes. No  cranial nerve deficit.  Skin: Skin is warm and dry. No rash noted.  Psychiatric:  Has  normal mood and affect. Behavior is normal.     Assessment & Plan:

## 2012-01-18 NOTE — Assessment & Plan Note (Signed)
Asympt, needs to re-establish for long term - ok to refer

## 2012-06-07 ENCOUNTER — Ambulatory Visit: Payer: BC Managed Care – PPO

## 2012-06-07 ENCOUNTER — Ambulatory Visit (INDEPENDENT_AMBULATORY_CARE_PROVIDER_SITE_OTHER): Payer: BC Managed Care – PPO | Admitting: Family Medicine

## 2012-06-07 VITALS — BP 159/100 | HR 98 | Temp 99.0°F | Resp 18 | Ht 63.5 in | Wt 175.4 lb

## 2012-06-07 DIAGNOSIS — S93609A Unspecified sprain of unspecified foot, initial encounter: Secondary | ICD-10-CM

## 2012-06-07 DIAGNOSIS — S96911A Strain of unspecified muscle and tendon at ankle and foot level, right foot, initial encounter: Secondary | ICD-10-CM

## 2012-06-07 DIAGNOSIS — M79609 Pain in unspecified limb: Secondary | ICD-10-CM

## 2012-06-07 DIAGNOSIS — M79671 Pain in right foot: Secondary | ICD-10-CM

## 2012-06-07 NOTE — Patient Instructions (Addendum)
Put ice to the foot 3 or 4 times daily in the next few days.  Minimize walking and weightbearing. Advise using the crutches.  I don't think he shouldn't drive until you are confident that you could slam on the brakes hard with that foot  Return in about 10 days for recheck and I said is much better.  Take ibuprofen up to 800 mg 3 times daily as needed for pain

## 2012-06-07 NOTE — Progress Notes (Signed)
Subjective: 42 year old man who stepped on a child's toy at home and turned his right foot. At first he thought it hurt his ankle, but he has decided it probably is the foot that hurts.  Objective: Mild swelling on the lateral aspect of the right foot. He's very tender over the proximal fifth metatarsal. He feels pain when he moves his toes, but they function fine.  Assessment: Suspicious for fracture of proximal fifth metatarsal. Foot pain  Plan: X-ray right foot  [facility] reading (PRIMARY) by  Dr. Alwyn Ren No fracture  Wear a Cam Walker and use crutches. Return in 10 days unless much much better.. the patient will be fitted and trained for this.    Fit and Train for camwalker and crutches. Maurene Capes, CMA

## 2013-01-12 ENCOUNTER — Other Ambulatory Visit: Payer: Self-pay

## 2013-02-21 ENCOUNTER — Ambulatory Visit (INDEPENDENT_AMBULATORY_CARE_PROVIDER_SITE_OTHER): Payer: BC Managed Care – PPO

## 2013-02-21 DIAGNOSIS — Z0000 Encounter for general adult medical examination without abnormal findings: Secondary | ICD-10-CM

## 2013-02-21 LAB — CBC WITH DIFFERENTIAL/PLATELET
Basophils Relative: 0.3 % (ref 0.0–3.0)
Eosinophils Relative: 1.7 % (ref 0.0–5.0)
HCT: 37.5 % — ABNORMAL LOW (ref 39.0–52.0)
Lymphocytes Relative: 25.3 % (ref 12.0–46.0)
Lymphs Abs: 2.3 10*3/uL (ref 0.7–4.0)
MCV: 67.8 fl — ABNORMAL LOW (ref 78.0–100.0)
Monocytes Absolute: 1.1 10*3/uL — ABNORMAL HIGH (ref 0.1–1.0)
Neutrophils Relative %: 60.7 % (ref 43.0–77.0)
RBC: 5.53 Mil/uL (ref 4.22–5.81)
WBC: 9 10*3/uL (ref 4.5–10.5)

## 2013-02-21 LAB — URINALYSIS, ROUTINE W REFLEX MICROSCOPIC
Bilirubin Urine: NEGATIVE
Ketones, ur: NEGATIVE
Leukocytes, UA: NEGATIVE
pH: 6 (ref 5.0–8.0)

## 2013-02-22 ENCOUNTER — Ambulatory Visit: Payer: BC Managed Care – PPO

## 2013-02-22 DIAGNOSIS — D649 Anemia, unspecified: Secondary | ICD-10-CM

## 2013-02-22 LAB — BASIC METABOLIC PANEL
BUN: 12 mg/dL (ref 6–23)
Calcium: 8.8 mg/dL (ref 8.4–10.5)
Creatinine, Ser: 1.2 mg/dL (ref 0.4–1.5)
GFR: 89.32 mL/min (ref >60.00)
Potassium: 4.1 mEq/L (ref 3.5–5.1)

## 2013-02-22 LAB — HEPATIC FUNCTION PANEL
ALT: 30 U/L (ref 0–53)
AST: 24 U/L (ref 0–37)
Alkaline Phosphatase: 61 U/L (ref 39–117)
Bilirubin, Direct: 0.1 mg/dL (ref 0.0–0.3)
Total Protein: 7 g/dL (ref 6.0–8.3)

## 2013-02-22 LAB — LIPID PANEL
Cholesterol: 145 mg/dL (ref 0–200)
VLDL: 50 mg/dL — ABNORMAL HIGH (ref 0.0–40.0)

## 2013-02-22 LAB — PSA: PSA: 1.89 ng/mL (ref 0.10–4.00)

## 2013-02-23 LAB — IBC PANEL
Iron: 62 ug/dL (ref 42–165)
Saturation Ratios: 19.4 % — ABNORMAL LOW (ref 20.0–50.0)
Transferrin: 228.8 mg/dL (ref 212.0–360.0)

## 2013-02-28 ENCOUNTER — Ambulatory Visit (INDEPENDENT_AMBULATORY_CARE_PROVIDER_SITE_OTHER): Payer: BC Managed Care – PPO | Admitting: Internal Medicine

## 2013-02-28 ENCOUNTER — Encounter: Payer: Self-pay | Admitting: Internal Medicine

## 2013-02-28 VITALS — BP 120/80 | HR 100 | Temp 98.5°F | Ht 65.0 in | Wt 177.0 lb

## 2013-02-28 DIAGNOSIS — D509 Iron deficiency anemia, unspecified: Secondary | ICD-10-CM

## 2013-02-28 DIAGNOSIS — Z0000 Encounter for general adult medical examination without abnormal findings: Secondary | ICD-10-CM

## 2013-02-28 MED ORDER — AMLODIPINE-OLMESARTAN 5-40 MG PO TABS: 1.0000 | ORAL_TABLET | Freq: Every day | ORAL | Status: DC

## 2013-02-28 NOTE — Assessment & Plan Note (Signed)
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

## 2013-02-28 NOTE — Patient Instructions (Signed)
Please continue all other medications as before, and refills have been done if requested. Please have the pharmacy call with any other refills you may need. Please continue your efforts at being more active, low cholesterol diet, and weight control. You are otherwise up to date with prevention measures today. Your lab work was OK today Please remember to sign up for My Chart if you have not done so, as this will be important to you in the future with finding out test results, communicating by private email, and scheduling acute appointments online when needed.  Please return in 1 year for your yearly visit, or sooner if needed, with Lab testing done 3-5 days before

## 2013-02-28 NOTE — Progress Notes (Signed)
Subjective:    Patient ID: Bradley Donaldson, male    DOB: [birth year], 42 y.o.   MRN: [id number]  HPI Here for wellness and f/u;  Overall doing ok;  Pt denies CP, worsening SOB, DOE, wheezing, orthopnea, PND, worsening LE edema, palpitations, dizziness or syncope.  Pt denies neurological change such as new headache, facial or extremity weakness.  Pt denies polydipsia, polyuria, or low sugar symptoms. Pt states overall good compliance with treatment and medications, good tolerability, and has been trying to follow lower cholesterol diet.  Pt denies worsening depressive symptoms, suicidal ideation or panic. No fever, night sweats, wt loss, loss of appetite, or other constitutional symptoms.  Pt states good ability with ADL's, has low fall risk, home safety reviewed and adequate, no other significant changes in hearing or vision, and only occasionally active with exercise.   Had some less active in last 6 mo with some wt gain.   No acute complaints Past Medical History  Diagnosis Date  . ANEMIA-IRON DEFICIENCY 03/18/2007  . HYPERTENSION 01/04/2007  . URI 12/09/2007  . ALLERGIC RHINITIS 03/18/2007  . CROHN'S DISEASE 01/04/2007  . DERMATITIS, SCALP 12/09/2007  . Incisional hernia 06/18/2010   Past Surgical History  Procedure Laterality Date  . Partial colon removal  1998    Right hemicolectomy    reports that he has never smoked. He has never used smokeless tobacco. He reports that he drinks alcohol. He reports that he does not use illicit drugs. family history includes Heart disease in his father; Hypertension in his other. Allergies  Allergen Reactions  . Ranitidine Hcl    Current Outpatient Prescriptions on File Prior to Visit  Medication Sig Dispense Refill  . cyanocobalamin (,VITAMIN B-12,) 1000 MCG/ML injection Inject 1,000 mcg into the muscle every 30 (thirty) days.         No current facility-administered medications on file prior to visit.    Review of Systems Constitutional: Negative for  diaphoresis, activity change, appetite change or unexpected weight change.  HENT: Negative for hearing loss, ear pain, facial swelling, mouth sores and neck stiffness.   Eyes: Negative for pain, redness and visual disturbance.  Respiratory: Negative for shortness of breath and wheezing.   Cardiovascular: Negative for chest pain and palpitations.  Gastrointestinal: Negative for diarrhea, blood in stool, abdominal distention or other pain Genitourinary: Negative for hematuria, flank pain or change in urine volume.  Musculoskeletal: Negative for myalgias and joint swelling.  Skin: Negative for color change and wound.  Neurological: Negative for syncope and numbness. other than noted Hematological: Negative for adenopathy.  Psychiatric/Behavioral: Negative for hallucinations, self-injury, decreased concentration and agitation.      Objective:   Physical Exam BP 120/80  Pulse 100  Temp(Src) 98.5 F (36.9 C) (Oral)  Ht 5\' 5"  (1.651 m)  Wt 177 lb (80.287 kg)  BMI 29.45 kg/m2  SpO2 99% VS noted,  Constitutional: Pt is oriented to person, place, and time. Appears well-developed and well-nourished.  Head: Normocephalic and atraumatic.  Right Ear: External ear normal.  Left Ear: External ear normal.  Nose: Nose normal.  Mouth/Throat: Oropharynx is clear and moist.  Eyes: Conjunctivae and EOM are normal. Pupils are equal, round, and reactive to light.  Neck: Normal range of motion. Neck supple. No JVD present. No tracheal deviation present.  Cardiovascular: Normal rate, regular rhythm, normal heart sounds and intact distal pulses.   Pulmonary/Chest: Effort normal and breath sounds normal.  Abdominal: Soft. Bowel sounds are normal. There is no tenderness. No HSM  Musculoskeletal: Normal range of motion. Exhibits no edema.  Lymphadenopathy:  Has no cervical adenopathy.  Neurological: Pt is alert and oriented to person, place, and time. Pt has normal reflexes. No cranial nerve deficit.  Skin:  Skin is warm and dry. No rash noted.  Psychiatric:  Has  normal mood and affect. Behavior is normal.     Assessment & Plan:

## 2013-02-28 NOTE — Assessment & Plan Note (Signed)

## 2013-02-28 NOTE — Progress Notes (Signed)
Pre-visit discussion using our clinic review tool. No additional management support is needed unless otherwise documented below in the visit note.  

## 2014-01-07 ENCOUNTER — Ambulatory Visit (INDEPENDENT_AMBULATORY_CARE_PROVIDER_SITE_OTHER): Payer: BC Managed Care – PPO | Admitting: Emergency Medicine

## 2014-01-07 VITALS — BP 128/82 | HR 100 | Temp 98.6°F | Resp 16 | Ht 64.0 in | Wt 177.0 lb

## 2014-01-07 DIAGNOSIS — L299 Pruritus, unspecified: Secondary | ICD-10-CM

## 2014-01-07 DIAGNOSIS — I10 Essential (primary) hypertension: Secondary | ICD-10-CM

## 2014-01-07 DIAGNOSIS — L219 Seborrheic dermatitis, unspecified: Secondary | ICD-10-CM

## 2014-01-07 LAB — POCT CBC
GRANULOCYTE PERCENT: 66.5 % (ref 37–80)
HEMATOCRIT: 42.7 % — AB (ref 43.5–53.7)
Hemoglobin: 13.3 g/dL — AB (ref 14.1–18.1)
Lymph, poc: 2.2 (ref 0.6–3.4)
MCH, POC: 22 pg — AB (ref 27–31.2)
MCHC: 31.3 g/dL — AB (ref 31.8–35.4)
MCV: 70.2 fL — AB (ref 80–97)
MID (cbc): 0.7 (ref 0–0.9)
MPV: 7.3 fL (ref 0–99.8)
PLATELET COUNT, POC: 358 10*3/uL (ref 142–424)
POC GRANULOCYTE: 5.9 (ref 2–6.9)
POC LYMPH %: 25.3 % (ref 10–50)
POC MID %: 8.2 % (ref 0–12)
RBC: 6.07 M/uL (ref 4.69–6.13)
RDW, POC: 17.5 %
WBC: 8.8 10*3/uL (ref 4.6–10.2)

## 2014-01-07 LAB — COMPLETE METABOLIC PANEL WITH GFR
ALT: 22 U/L (ref 0–53)
AST: 21 U/L (ref 0–37)
Albumin: 4.4 g/dL (ref 3.5–5.2)
Alkaline Phosphatase: 68 U/L (ref 39–117)
BILIRUBIN TOTAL: 0.4 mg/dL (ref 0.2–1.2)
BUN: 11 mg/dL (ref 6–23)
CO2: 29 mEq/L (ref 19–32)
Calcium: 9.6 mg/dL (ref 8.4–10.5)
Chloride: 100 mEq/L (ref 96–112)
Creat: 1.13 mg/dL (ref 0.50–1.35)
GFR, EST NON AFRICAN AMERICAN: 79 mL/min
GFR, Est African American: >89 mL/min
GLUCOSE: 106 mg/dL — AB (ref 70–99)
Potassium: 4.1 mEq/L (ref 3.5–5.3)
SODIUM: 139 meq/L (ref 135–145)
TOTAL PROTEIN: 8.2 g/dL (ref 6.0–8.3)

## 2014-01-07 LAB — HIV ANTIBODY (ROUTINE TESTING W REFLEX): HIV 1&2 Ab, 4th Generation: NONREACTIVE

## 2014-01-07 MED ORDER — FLUOCINOLONE ACETONIDE 0.01 % EX SOLN: Freq: Two times a day (BID) | CUTANEOUS | Status: DC

## 2014-01-07 NOTE — Progress Notes (Signed)
   Subjective:    Patient ID: Bradley Donaldson, male    DOB: April 01, 1970, 43 y.o.   MRN: [id number]  HPI Chief Complaint  Patient presents with  . itchy skin    no rash going on 2 weeks   This chart was scribed for Arlyss Queen, MD by Thea Alken, ED Scribe. This patient was seen in room 9 and the patient's care was started at 9:25 AM.  HPI Comments: Bradley Donaldson is a 43 y.o. male who presents to the [facility] complaining of itchy skin to hands, back and arms the began 2 weeks ago. Pt denies seeing rash, bumps and raised area. He reports itching worsens during the night. Pt has taken benadryl with relief to itchy. Pt takes azor and has for years but denies new medication. Pt reports hx of a scalp dermatitis in which he uses Nizoral shampoo and tea gel shampoo with minimal relief. Pt works at home.    Past Medical History  Diagnosis Date  . ANEMIA-IRON DEFICIENCY 03/18/2007  . HYPERTENSION 01/04/2007  . URI 12/09/2007  . ALLERGIC RHINITIS 03/18/2007  . CROHN'S DISEASE 01/04/2007  . DERMATITIS, SCALP 12/09/2007  . Incisional hernia 06/18/2010   Allergies  Allergen Reactions  . Ranitidine Hcl    Prior to Admission medications   Medication Sig Start Date End Date Taking? Authorizing Provider  amLODipine-olmesartan (AZOR) 5-40 MG per tablet Take 1 tablet by mouth daily. 02/28/13  Yes Biagio Borg, MD  cyanocobalamin (,VITAMIN B-12,) 1000 MCG/ML injection Inject 1,000 mcg into the muscle every 30 (thirty) days.      Historical Provider, MD   Review of Systems  Constitutional: Negative for fever and chills.  Skin: Negative for rash and wound.    Objective:   Physical Exam  CONSTITUTIONAL: Well developed/well nourished HEAD: Normocephalic/atraumatic EYES: EOMI/PERRL ENMT: Mucous membranes moist NECK: supple no meningeal signs SPINE:entire spine nontender CV: S1/S2 noted, no murmurs/rubs/gallops noted, heart exam negative LUNGS: Lungs are clear to auscultation  bilaterally, no apparent distress, lung exam negative. ABDOMEN: soft, nontender, no rebound or guarding GU:no cva tenderness NEURO: Pt is awake/alert, moves all extremitiesx4 EXTREMITIES: pulses normal, full ROM SKIN: warm, color normal, some scaling of the scalp with no areas of baldness, scaling of the skin of the back with hyperpigmentation.  PSYCH: no abnormalities of mood noted Results for orders placed or performed in visit on 01/07/14  POCT CBC  Result Value Ref Range   WBC 8.8 4.6 - 10.2 K/uL   Lymph, poc 2.2 0.6 - 3.4   POC LYMPH PERCENT 25.3 10 - 50 %L   MID (cbc) 0.7 0 - 0.9   POC MID % 8.2 0 - 12 %M   POC Granulocyte 5.9 2 - 6.9   Granulocyte percent 66.5 37 - 80 %G   RBC 6.07 4.69 - 6.13 M/uL   Hemoglobin 13.3 (A) 14.1 - 18.1 g/dL   HCT, POC 42.7 (A) 43.5 - 53.7 %   MCV 70.2 (A) 80 - 97 fL   MCH, POC 22.0 (A) 27 - 31.2 pg   MCHC 31.3 (A) 31.8 - 35.4 g/dL   RDW, POC 17.5 %   Platelet Count, POC 358 142 - 424 K/uL   MPV 7.3 0 - 99.8 fL  .  Assessment & Plan:    I personally performed the services described in this documentation, which was scribed in my presence. The recorded information has been reviewed and is accurate.

## 2014-01-07 NOTE — Patient Instructions (Signed)
Take Zyrtec 10 mg 1 a day. Call if you continue to have problems and referral will be made to an allergist.

## 2014-01-30 ENCOUNTER — Ambulatory Visit (INDEPENDENT_AMBULATORY_CARE_PROVIDER_SITE_OTHER): Payer: BC Managed Care – PPO | Admitting: Internal Medicine

## 2014-01-30 ENCOUNTER — Encounter: Payer: Self-pay | Admitting: Internal Medicine

## 2014-01-30 VITALS — BP 124/82 | HR 86 | Temp 98.9°F | Ht 64.0 in | Wt 181.0 lb

## 2014-01-30 DIAGNOSIS — R21 Rash and other nonspecific skin eruption: Secondary | ICD-10-CM

## 2014-01-30 DIAGNOSIS — L219 Seborrheic dermatitis, unspecified: Secondary | ICD-10-CM

## 2014-01-30 DIAGNOSIS — L218 Other seborrheic dermatitis: Secondary | ICD-10-CM

## 2014-01-30 DIAGNOSIS — L299 Pruritus, unspecified: Secondary | ICD-10-CM

## 2014-01-30 DIAGNOSIS — Z0000 Encounter for general adult medical examination without abnormal findings: Secondary | ICD-10-CM

## 2014-01-30 MED ORDER — PREDNISONE 10 MG PO TABS: ORAL_TABLET | ORAL | Status: DC

## 2014-01-30 MED ORDER — METHYLPREDNISOLONE ACETATE 80 MG/ML IJ SUSP
80.0000 mg | Freq: Once | INTRAMUSCULAR | Status: AC
  Administered 2014-01-30: 80 mg via INTRAMUSCULAR

## 2014-01-30 MED ORDER — AMLODIPINE-OLMESARTAN 5-40 MG PO TABS: 1.0000 | ORAL_TABLET | Freq: Every day | ORAL | Status: DC

## 2014-01-30 NOTE — Progress Notes (Signed)
Subjective:    Patient ID: Bradley Donaldson, male    DOB: 1971-01-27, 43 y.o.   MRN: [id number]  HPI   Here for wellness and f/u;  Overall doing ok;  Pt denies CP, worsening SOB, DOE, wheezing, orthopnea, PND, worsening LE edema, palpitations, dizziness or syncope.  Pt denies neurological change such as new headache, facial or extremity weakness.  Pt denies polydipsia, polyuria, or low sugar symptoms. Pt states overall good compliance with treatment and medications, good tolerability, and has been trying to follow lower cholesterol diet.  Pt denies worsening depressive symptoms, suicidal ideation or panic. No fever, night sweats, wt loss, loss of appetite, or other constitutional symptoms.  Pt states good ability with ADL's, has low fall risk, home safety reviewed and adequate, no other significant changes in hearing or vision, and only occasionally active with exercise.  Here with 1 mo ongoing itching all over without rash, seen in ED Nov 1 with neg cbc/cmet/hiv, tx with zyrtec but made too sleepy, not helped with otc allegra in place of this, and also given steroid cream for scalp itching which does help.  No rash, new med, or liver dz, ? Dry skin ongoing. Notongue or lip swelling. Pt denies chest pain, increased sob or doe, wheezing, orthopnea, PND, increased LE swelling, palpitations, dizziness or syncope .  No new meds, has been on azor for several yrs. Past Medical History  Diagnosis Date  . ANEMIA-IRON DEFICIENCY 03/18/2007  . HYPERTENSION 01/04/2007  . URI 12/09/2007  . ALLERGIC RHINITIS 03/18/2007  . CROHN'S DISEASE 01/04/2007  . DERMATITIS, SCALP 12/09/2007  . Incisional hernia 06/18/2010   Past Surgical History  Procedure Laterality Date  . Partial colon removal  1998    Right hemicolectomy    reports that he has never smoked. He has never used smokeless tobacco. He reports that he drinks alcohol. He reports that he does not use illicit drugs. family history includes Heart disease in his  father; Hypertension in his other. Allergies  Allergen Reactions  . Ranitidine Hcl    Current Outpatient Prescriptions on File Prior to Visit  Medication Sig Dispense Refill  . cyanocobalamin (,VITAMIN B-12,) 1000 MCG/ML injection Inject 1,000 mcg into the muscle every 30 (thirty) days.      . fluocinolone (SYNALAR) 0.01 % external solution Apply topically 2 (two) times daily. Applied 10 drops to the scalp after shampooing once daily (Patient not taking: Reported on 01/30/2014) 60 mL 0   No current facility-administered medications on file prior to visit.   Review of Systems Constitutional: Negative for increased diaphoresis, other activity, appetite or other siginficant weight change  HENT: Negative for worsening hearing loss, ear pain, facial swelling, mouth sores and neck stiffness.   Eyes: Negative for other worsening pain, redness or visual disturbance.  Respiratory: Negative for shortness of breath and wheezing.   Cardiovascular: Negative for chest pain and palpitations.  Gastrointestinal: Negative for diarrhea, blood in stool, abdominal distention or other pain Genitourinary: Negative for hematuria, flank pain or change in urine volume.  Musculoskeletal: Negative for myalgias or other joint complaints.  Skin: Negative for color change and wound.  Neurological: Negative for syncope and numbness. other than noted Hematological: Negative for adenopathy. or other swelling Psychiatric/Behavioral: Negative for hallucinations, self-injury, decreased concentration or other worsening agitation.      Objective:   Physical Exam BP 124/82 mmHg  Pulse 86  Temp(Src) 98.9 F (37.2 C) (Oral)  Ht 5\' 4"  (1.626 m)  Wt 181 lb (82.101 kg)  BMI 31.05 kg/m2  SpO2 99% VS noted,  Constitutional: Pt is oriented to person, place, and time. Appears well-developed and well-nourished.  Head: Normocephalic and atraumatic.  Right Ear: External ear normal.  Left Ear: External ear normal.  Nose: Nose  normal.  Mouth/Throat: Oropharynx is clear and moist.  Eyes: Conjunctivae and EOM are normal. Pupils are equal, round, and reactive to light.  Neck: Normal range of motion. Neck supple. No JVD present. No tracheal deviation present.  Cardiovascular: Normal rate, regular rhythm, normal heart sounds and intact distal pulses.   Pulmonary/Chest: Effort normal and breath sounds without rales or wheezing  Abdominal: Soft. Bowel sounds are normal. NT. No HSM  Musculoskeletal: Normal range of motion. Exhibits no edema.  Lymphadenopathy:  Has no cervical adenopathy.  Neurological: Pt is alert and oriented to person, place, and time. Pt has normal reflexes. No cranial nerve deficit. Motor grossly intact Skin: Skin is warm and dry. No rash noted.  Psychiatric:  Has normal mood and affect. Behavior is normal.  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

## 2014-01-30 NOTE — Assessment & Plan Note (Addendum)
Unclear etiology, recent scraping neg for fungal per pt, consider derm but pt declines at this time

## 2014-01-30 NOTE — Assessment & Plan Note (Signed)
Improved, asytmp today improved with topical steroid, to cont asd

## 2014-01-30 NOTE — Progress Notes (Signed)
Pre visit review using our clinic review tool, if applicable. No additional management support is needed unless otherwise documented below in the visit note. 

## 2014-01-30 NOTE — Assessment & Plan Note (Signed)

## 2014-01-30 NOTE — Assessment & Plan Note (Signed)
Etiology unclear, ? Allergic vs other, not better with zyrtec/allegra - for depomedrol IM, then predpac asd, consider allergy if better then worseas well as consider change azor but doubt at this time, for eucerin cream prn as well, consider derm referral

## 2014-01-30 NOTE — Patient Instructions (Signed)
You had the steroid shot today  Please take all new medication as prescribed - the prednisone  Please continue all other medications as before, and refills have been done if requested.  Please have the pharmacy call with any other refills you may need.  Please continue your efforts at being more active, low cholesterol diet, and weight control.  You are otherwise up to date with prevention measures today.  Please keep your appointments with your specialists as you may have planned  Please go to the LAB in the Basement (turn left off the elevator) for the tests to be done today  You will be contacted by phone if any changes need to be made immediately.  Otherwise, you will receive a letter about your results with an explanation, but please check with MyChart first.  Please remember to sign up for MyChart if you have not done so, as this will be important to you in the future with finding out test results, communicating by private email, and scheduling acute appointments online when needed.  Please return in 1 year for your yearly visit, or sooner if needed

## 2014-01-31 ENCOUNTER — Other Ambulatory Visit (INDEPENDENT_AMBULATORY_CARE_PROVIDER_SITE_OTHER): Payer: BC Managed Care – PPO

## 2014-01-31 DIAGNOSIS — Z0000 Encounter for general adult medical examination without abnormal findings: Secondary | ICD-10-CM

## 2014-01-31 LAB — URINALYSIS, ROUTINE W REFLEX MICROSCOPIC
Bilirubin Urine: NEGATIVE
KETONES UR: NEGATIVE
Leukocytes, UA: NEGATIVE
NITRITE: NEGATIVE
Total Protein, Urine: NEGATIVE
UROBILINOGEN UA: 0.2 (ref 0.0–1.0)
Urine Glucose: NEGATIVE
WBC, UA: NONE SEEN (ref 0–?)
pH: 5.5 (ref 5.0–8.0)

## 2014-01-31 LAB — CBC WITH DIFFERENTIAL/PLATELET
BASOS ABS: 0 10*3/uL (ref 0.0–0.1)
Basophils Relative: 0.4 % (ref 0.0–3.0)
EOS PCT: 1.1 % (ref 0.0–5.0)
Eosinophils Absolute: 0.1 10*3/uL (ref 0.0–0.7)
HEMATOCRIT: 40.8 % (ref 39.0–52.0)
Hemoglobin: 12.9 g/dL — ABNORMAL LOW (ref 13.0–17.0)
LYMPHS ABS: 2.6 10*3/uL (ref 0.7–4.0)
LYMPHS PCT: 27.9 % (ref 12.0–46.0)
MCHC: 31.7 g/dL (ref 30.0–36.0)
MCV: 67.9 fl — ABNORMAL LOW (ref 78.0–100.0)
Monocytes Absolute: 0.7 10*3/uL (ref 0.1–1.0)
Monocytes Relative: 7.4 % (ref 3.0–12.0)
Neutro Abs: 6 10*3/uL (ref 1.4–7.7)
Neutrophils Relative %: 63.2 % (ref 43.0–77.0)
Platelets: 360 10*3/uL (ref 150.0–400.0)
RBC: 6 Mil/uL — ABNORMAL HIGH (ref 4.22–5.81)
RDW: 16.5 % — AB (ref 11.5–15.5)
WBC: 9.5 10*3/uL (ref 4.0–10.5)

## 2014-01-31 LAB — LIPID PANEL
Cholesterol: 154 mg/dL (ref 0–200)
HDL: 39.2 mg/dL (ref >39.00)
LDL CALC: 93 mg/dL (ref 0–99)
NONHDL: 114.8
Total CHOL/HDL Ratio: 4
Triglycerides: 108 mg/dL (ref 0.0–149.0)
VLDL: 21.6 mg/dL (ref 0.0–40.0)

## 2014-01-31 LAB — BASIC METABOLIC PANEL
BUN: 12 mg/dL (ref 6–23)
CALCIUM: 9.2 mg/dL (ref 8.4–10.5)
CO2: 27 mEq/L (ref 19–32)
CREATININE: 1.2 mg/dL (ref 0.4–1.5)
Chloride: 102 mEq/L (ref 96–112)
GFR: 88.93 mL/min (ref >60.00)
Glucose, Bld: 89 mg/dL (ref 70–99)
Potassium: 4.3 mEq/L (ref 3.5–5.1)
Sodium: 137 mEq/L (ref 135–145)

## 2014-01-31 LAB — PSA: PSA: 1.64 ng/mL (ref 0.10–4.00)

## 2014-01-31 LAB — HEPATIC FUNCTION PANEL
ALBUMIN: 4.1 g/dL (ref 3.5–5.2)
ALK PHOS: 70 U/L (ref 39–117)
ALT: 25 U/L (ref 0–53)
AST: 23 U/L (ref 0–37)
Bilirubin, Direct: 0.1 mg/dL (ref 0.0–0.3)
Total Bilirubin: 0.7 mg/dL (ref 0.2–1.2)
Total Protein: 7.9 g/dL (ref 6.0–8.3)

## 2014-01-31 LAB — TSH: TSH: 0.88 u[IU]/mL (ref 0.35–4.50)

## 2014-02-06 ENCOUNTER — Telehealth: Payer: Self-pay | Admitting: *Deleted

## 2014-02-06 NOTE — Telephone Encounter (Signed)
Yes, for sure, ok to take together

## 2014-02-06 NOTE — Telephone Encounter (Signed)
Pt is wanting to know can he take allegra along with the prednisone that was rx because he is still having the itching...Bradley Donaldson

## 2014-02-06 NOTE — Telephone Encounter (Signed)
Notified pt with md response.../lmb 

## 2014-03-13 ENCOUNTER — Ambulatory Visit (INDEPENDENT_AMBULATORY_CARE_PROVIDER_SITE_OTHER): Payer: BC Managed Care – PPO | Admitting: Physician Assistant

## 2014-03-13 VITALS — BP 140/88 | HR 125 | Temp 99.6°F | Resp 16 | Ht 63.0 in | Wt 178.0 lb

## 2014-03-13 DIAGNOSIS — L299 Pruritus, unspecified: Secondary | ICD-10-CM

## 2014-03-13 DIAGNOSIS — R319 Hematuria, unspecified: Secondary | ICD-10-CM

## 2014-03-13 DIAGNOSIS — D539 Nutritional anemia, unspecified: Secondary | ICD-10-CM

## 2014-03-13 LAB — POCT CBC
Granulocyte percent: 74.8 %G (ref 37–80)
HEMATOCRIT: 41.8 % — AB (ref 43.5–53.7)
Hemoglobin: 12.9 g/dL — AB (ref 14.1–18.1)
LYMPH, POC: 1.9 (ref 0.6–3.4)
MCH, POC: 22.4 pg — AB (ref 27–31.2)
MCHC: 30.8 g/dL — AB (ref 31.8–35.4)
MCV: 72.6 fL — AB (ref 80–97)
MID (cbc): 0.3 (ref 0–0.9)
MPV: 6.8 fL (ref 0–99.8)
POC Granulocyte: 6.5 (ref 2–6.9)
POC LYMPH PERCENT: 22.3 %L (ref 10–50)
POC MID %: 2.9 %M (ref 0–12)
Platelet Count, POC: 357 10*3/uL (ref 142–424)
RBC: 5.76 M/uL (ref 4.69–6.13)
RDW, POC: 17.6 %
WBC: 8.7 10*3/uL (ref 4.6–10.2)

## 2014-03-13 LAB — POCT UA - MICROSCOPIC ONLY
BACTERIA, U MICROSCOPIC: NEGATIVE
CASTS, UR, LPF, POC: NEGATIVE
Crystals, Ur, HPF, POC: NEGATIVE
MUCUS UA: NEGATIVE
Yeast, UA: NEGATIVE

## 2014-03-13 LAB — POCT URINALYSIS DIPSTICK
Bilirubin, UA: NEGATIVE
Glucose, UA: NEGATIVE
LEUKOCYTES UA: NEGATIVE
NITRITE UA: NEGATIVE
PROTEIN UA: NEGATIVE
Spec Grav, UA: >=1.03
UROBILINOGEN UA: 0.2
pH, UA: 5.5

## 2014-03-13 NOTE — Patient Instructions (Addendum)
Your specific gravity is abnormal, indicates that you must drink more water.  You should be drinking about 64 oz of water.  This will hydrate your skin and help with itching.   Please follow up with your pcp after 1-2 months.   They should recheck your urine.  The lack of hydration needs to be checked, to have a more accurate urinalysis.  Hydrate your skin at least twice per day.  Good moisturizers include eucerin.  And avoid scented soaps and hygeine products.   Continue to take the allegra for the itching.   I am referring you to an allergist.

## 2014-03-13 NOTE — Progress Notes (Signed)
MRN: [id number] DOB: 26-May-1970  Subjective:   Chief Complaint  Patient presents with  . Follow-up    Rash from 01/07/14--still has not gone away    Benedetto Ryder is a 44 y.o. male presenting for follow up of pruritus.  This has been occuring for 3 months.  He states that he continues to have the pruritus at his elbow, back of neck, legs, and wrist.  He states that the pruritus will surface at one area during each episode.  He denies current rash, but he states that he did have welts across his back.  The pruritus is resolved for days with allegra, but will resurface if he goes for days without taking the medication.  It is also aggravated after showers.  The allegra and a cool washcloth helps with the itching.  Patient states that he is not using any new hygiene products.  He denies dyspnea, sob, chest pain, palpitations, or sob.  He uses dove, scented.  And nivea moisturizers.  He does not always use the moisturizers following showers.  He drinks about 16-32 oz of water each day.  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  He works from home, and cares for his 20 and 44 year old.  He notes that the pruritus does not occur when he is not stressed.      Salathiel has a current medication list which includes the following prescription(s): amlodipine-olmesartan, fexofenadine, and fluocinolone.  He is allergic to ranitidine hcl.  Shadrach  has a past medical history of ANEMIA-IRON DEFICIENCY (03/18/2007); HYPERTENSION (01/04/2007); URI (12/09/2007); ALLERGIC RHINITIS (03/18/2007); CROHN'S DISEASE (01/04/2007); DERMATITIS, SCALP (12/09/2007); and Incisional hernia (06/18/2010). Also  has past surgical history that includes partial colon removal (1998).  ROS As in subjective.  Objective:   Vitals: BP 140/88 mmHg  Pulse 125  Temp(Src) 99.6 F (37.6 C)  Resp 16  Ht 5\' 3"  (1.6 m)  Wt 178 lb (80.74 kg)  BMI 31.54 kg/m2  SpO2 99%  Physical  Exam  Constitutional: He is oriented to person, place, and time and well-developed, well-nourished, and in no distress. No distress.  Eyes: Conjunctivae are normal. Pupils are equal, round, and reactive to light.  Cardiovascular: Normal rate and regular rhythm.  Exam reveals no gallop and no friction rub.   No murmur heard. Pulmonary/Chest: Effort normal and breath sounds normal. No respiratory distress. He has no wheezes.  Musculoskeletal: He exhibits no edema.  Neurological: He is alert and oriented to person, place, and time.  Skin: Skin is warm, dry and intact.  Hyperpigmentaton at middle of back with diffusely placed pustules of acne.  Skin is dry with flaking along extremities.  Flaking of scalp, without sports of thinning hair.    Psychiatric: Mood, memory, affect and judgment normal.  No welts sited on body.  Patient presented photo of upper back "welts" which appeared as excoriated skin. Rechecked HR at 80.   Results for orders placed or performed in visit on 03/13/14  POCT UA - Microscopic Only  Result Value Ref Range   WBC, Ur, HPF, POC 0-2    RBC, urine, microscopic 0-1    Bacteria, U Microscopic neg    Mucus, UA neg    Epithelial cells, urine per micros 1-2    Crystals, Ur, HPF, POC neg    Casts, Ur, LPF, POC neg    Yeast, UA neg   POCT urinalysis dipstick  Result  Value Ref Range   Color, UA yellow    Clarity, UA clear    Glucose, UA neg    Bilirubin, UA neg    Ketones, UA trace    Spec Grav, UA >=1.030    Blood, UA small    pH, UA 5.5    Protein, UA neg    Urobilinogen, UA 0.2    Nitrite, UA neg    Leukocytes, UA Negative   POCT CBC  Result Value Ref Range   WBC 8.7 4.6 - 10.2 K/uL   Lymph, poc 1.9 0.6 - 3.4   POC LYMPH PERCENT 22.3 10 - 50 %L   MID (cbc) 0.3 0 - 0.9   POC MID % 2.9 0 - 12 %M   POC Granulocyte 6.5 2 - 6.9   Granulocyte percent 74.8 37 - 80 %G   RBC 5.76 4.69 - 6.13 M/uL   Hemoglobin 12.9 (A) 14.1 - 18.1 g/dL   HCT, POC 41.8 (A) 43.5 -  53.7 %   MCV 72.6 (A) 80 - 97 fL   MCH, POC 22.4 (A) 27 - 31.2 pg   MCHC 30.8 (A) 31.8 - 35.4 g/dL   RDW, POC 17.6 %   Platelet Count, POC 357 142 - 424 K/uL   MPV 6.8 0 - 99.8 fL      Assessment and Plan :  44 year old male is here today with PMH listed above, presenting today for continued pruritus.    Pruritus - Plan: POCT CBC, Ambulatory referral to Allergy -This appears to be an allergic reaction, as the allegra calms the itching.  Metabolic panels are normal.    Hematuria - Plan: POCT UA - Microscopic Only, POCT urinalysis dipstick -Small blood in urine, however extremely dehydrated.  He will hydrate 48-64 oz per day and return in 1-2 months for follow up urinalysis.  This is most likely due to a false positive, so we will get a more accurate urinalysis with follow up.  If positive, refer to urology.    Deficiency Anemia Patient will resume iron supplements and increase fiber intake.  Recheck at 2 month mark.    Ivar Drape, PA-C [facility] 1/6/201611:04 PM

## 2014-08-08 ENCOUNTER — Telehealth: Payer: Self-pay | Admitting: *Deleted

## 2014-08-08 ENCOUNTER — Telehealth: Payer: Self-pay | Admitting: Internal Medicine

## 2014-08-08 NOTE — Telephone Encounter (Signed)
[facility] Patient Name: Bradley Donaldson DOB: 03/27/1970 Initial Comment Caller states having issues w/ his back on the left side, very sore and a little swollen Nurse Assessment Nurse: Markus Daft, RN, [place] Date/Time (Eastern Time): 08/08/2014 9:20:11 AM Confirm and document reason for call. If symptomatic, describe symptoms. ---1) Caller states having pain to touch on his left side to mid back and a little swollen also. Sore to touch, rates 2/10, otherwise no pain. Noticed 2 months ago with tender to touch, and went away and returned last night. No injury or fever. 2) Also, reports widespread itching. Takes Allegra to relieve this which does help. No rash. Has the patient traveled out of the country within the last 30 days? ---Not Applicable Does the patient require triage? ---Yes Related visit to physician within the last 2 weeks? ---No Does the PT have any chronic conditions? (i.e. diabetes, asthma, etc.) ---Yes List chronic conditions. ---HTN, seasonal allergies Guidelines Guideline Title Affirmed Question Affirmed Notes Back Pain Back pain present > 2 weeks Final Disposition User See PCP within [place], RN, Sherre Poot Comments Caller states that he wants to wait a weekend see if this subsides, and he will call back for an appt within 2 wks. if he still feels it is needed.

## 2014-08-08 NOTE — Telephone Encounter (Signed)
[facility] Client [place] [facility] Patient Name: Bradley Donaldson Gender: Male DOB: [birth year] Age: 44 Y 3 M 24 D Return Phone Number: [phone] (Primary) Address: City/State/Zip: [place] Client [facility] - Client Client Site [place] - Day Physician Cathlean Cower Contact Type Call Call Type Triage / Clinical Relationship To Patient Self Appointment Disposition EMR Patient Refused Appointment Info pasted into Epic Yes Return Phone Number [phone] (Primary) Chief Complaint Back Pain - General Initial Comment Caller states having issues w/ his back on the left side, very sore and a little swollen PreDisposition Did not know what to do Nurse Assessment Nurse: Markus Daft, RN, Sherre Poot Date/Time (Eastern Time): 08/08/2014 9:20:11 AM Confirm and document reason for call. If symptomatic, describe symptoms. ---1) Caller states having pain to touch on his left side to mid back and a little swollen also. Sore to touch, rates 2/10, otherwise no pain. Noticed 2 months ago with tender to touch, and went away and returned last night. No injury or fever. 2) Also, reports widespread itching. Takes Allegra to relieve this which does help. No rash. Has the patient traveled out of the country within the last 30 days? ---Not Applicable Does the patient require triage? ---Yes Related visit to physician within the last 2 weeks? ---No Does the PT have any chronic conditions? (i.e. diabetes, asthma, etc.) ---Yes List chronic conditions. ---HTN, seasonal allergies Guidelines Guideline Title Affirmed Question Affirmed Notes Nurse Date/Time (Eastern Time) Back Pain Back pain present > 2 weeks Markus Daft, RN, [place] 08/08/2014 9:25:01 AM Disp. Time Eilene Ghazi Time) Disposition Final User 08/08/2014 9:30:10 AM See PCP within 2 Weeks Yes Markus Daft, RN, Kenton Kingfisher Understands: Yes Disagree/Comply: Comply PLEASE NOTE: All  timestamps contained within this report are represented as [place] Standard Time. CONFIDENTIALTY NOTICE: This fax transmission is intended only for the addressee. It contains information that is legally privileged, confidential or otherwise protected from use or disclosure. If you are not the intended recipient, you are strictly prohibited from reviewing, disclosing, copying using or disseminating any of this information or taking any action in reliance on or regarding this information. If you have received this fax in error, please notify us immediately by telephone so that we can arrange for its return to [place]. Phone: [phone], Toll-Free: [phone], Fax: [phone] Page: 2 of 2 Call Id: [id number] Care Advice Given Per Guideline SEE PCP WITHIN 2 WEEKS: You need an evaluation for this ongoing problem within the next 2 weeks. Call your doctor during regular office hours and make an appointment. REASSURANCE: * It doesn't sound serious. Back pain can result from excessive twisting, heavy lifting, or from un-noticed minor back injuries. It can also be caused by age-related degenerative changes of the spine. * However, since your back pain has lasted longer than 2 weeks, you should get a medical check-up. LOCAL HEAT: Apply a heating pad or hot water bottle to the most painful area for 20 minutes whenever the pain flares up. (Caution about burns.) SLEEP: Sleep on your side with a pillow between your knees. If you sleep on your back, place a pillow under your knees. Avoid sleeping on the abdomen. The mattress should be firm or reinforced with a board. Avoid waterbeds. ACTIVITY: * Continue ordinary activities as much as your pain permits. Continued activity is more healing for the back than rest. * Avoid any activities that cause severe pain. * Use caution and commonsense when performing heavy lifting. Similarly, be  careful during strenuous exercise. PAIN MEDICINES: * For pain relief, take  acetaminophen, ibuprofen, or naproxen. * Before taking any medicine, read all the instructions

## 2014-08-21 NOTE — Progress Notes (Signed)
error 

## 2014-12-11 ENCOUNTER — Encounter: Payer: Self-pay | Admitting: Internal Medicine

## 2014-12-11 ENCOUNTER — Ambulatory Visit (INDEPENDENT_AMBULATORY_CARE_PROVIDER_SITE_OTHER): Payer: BC Managed Care – PPO | Admitting: Internal Medicine

## 2014-12-11 VITALS — BP 136/90 | HR 81 | Temp 98.9°F | Resp 16 | Wt 179.0 lb

## 2014-12-11 DIAGNOSIS — D171 Benign lipomatous neoplasm of skin and subcutaneous tissue of trunk: Secondary | ICD-10-CM

## 2014-12-11 NOTE — Patient Instructions (Signed)
Simply monitor the size of the lipoma once a month. It should be removed if it increases in size or is associated with change in size, color, temperature, or discomfort.

## 2014-12-11 NOTE — Progress Notes (Signed)
   Subjective:    Patient ID: Bradley Donaldson, male    DOB: 1970/05/19, 44 y.o.   MRN: [id number]  HPI For several months he's had discomfort in the left flank if he lies supine for several hours. There was no trigger or injury in this area. He has no associated dermatologic, constitutional, cardiopulmonary, GI, GU symptoms.  He has had intermittent diarrhea related to his Crohn's disease.  Review of Systems  There is no significant cough, sputum production,hemoptysis, wheezing,or  paroxysmal nocturnal dyspnea. Unexplained weight loss, abdominal pain, significant dyspepsia, dysphagia, melena, rectal bleeding, or persistently small caliber stools are not present. Dysuria, pyuria, hematuria, frequency, nocturia or polyuria are denied.     Objective:   Physical Exam  Pertinent or positive findings include: Gynecomastia is ssuggested. There is a horizontal op scar in the right upper quadrant which is well-healed. There is a 6 x 6.5 cm lipoma over the left flank immediately below the thorax. It is movable and not attached to subcutaneous tissues. It does transilluminate. He has no other cervical or axillary lymphadenopathy. He has no organomegaly.   General appearance :adequately nourished; in no distress.BMI 31.72  Eyes: No conjunctival inflammation or scleral icterus is present.  Heart:  Normal rate and regular rhythm. S1 and S2 normal without gallop, murmur, click, rub or other extra sounds    Lungs:Chest clear to auscultation; no wheezes, rhonchi,rales ,or rubs present.No increased work of breathing.   Abdomen: bowel sounds normal, soft and non-tender without masses, organomegaly or hernias noted.  No guarding or rebound. No flank tenderness to percussion.  Vascular : all pulses equal ; no bruits present.  Skin:Warm & dry.  Intact without suspicious lesions or rashes ; no tenting   Neuro: Strength, tone & DTRs normal.     Assessment & Plan:  #1 lipoma, left flank  Plan:  Pathophysiology was explained. He wants to defer a surgical referral at this time.

## 2014-12-11 NOTE — Progress Notes (Signed)
Pre visit review using our clinic review tool, if applicable. No additional management support is needed unless otherwise documented below in the visit note. 

## 2015-03-19 ENCOUNTER — Telehealth: Payer: Self-pay | Admitting: *Deleted

## 2015-03-19 DIAGNOSIS — Z125 Encounter for screening for malignant neoplasm of prostate: Secondary | ICD-10-CM

## 2015-03-19 DIAGNOSIS — Z0000 Encounter for general adult medical examination without abnormal findings: Secondary | ICD-10-CM

## 2015-03-19 MED ORDER — AMLODIPINE-OLMESARTAN 5-40 MG PO TABS: 1.0000 | ORAL_TABLET | Freq: Every day | ORAL | Status: DC

## 2015-03-19 NOTE — Telephone Encounter (Signed)
Received call pt states he is needing refill on his [place]. Inform pt he is overdue for his CPX can send a 30 day into pharmacy until he see md. Made appt for 04/09/15...Bradley Donaldson

## 2015-04-04 ENCOUNTER — Other Ambulatory Visit (INDEPENDENT_AMBULATORY_CARE_PROVIDER_SITE_OTHER): Payer: BC Managed Care – PPO

## 2015-04-04 DIAGNOSIS — Z0189 Encounter for other specified special examinations: Secondary | ICD-10-CM | POA: Diagnosis not present

## 2015-04-04 DIAGNOSIS — Z0000 Encounter for general adult medical examination without abnormal findings: Secondary | ICD-10-CM

## 2015-04-04 DIAGNOSIS — Z125 Encounter for screening for malignant neoplasm of prostate: Secondary | ICD-10-CM

## 2015-04-04 LAB — CBC WITH DIFFERENTIAL/PLATELET
BASOS ABS: 0 10*3/uL (ref 0.0–0.1)
Basophils Relative: 0.4 % (ref 0.0–3.0)
EOS ABS: 0.1 10*3/uL (ref 0.0–0.7)
Eosinophils Relative: 1.3 % (ref 0.0–5.0)
HEMATOCRIT: 40.1 % (ref 39.0–52.0)
HEMOGLOBIN: 12.6 g/dL — AB (ref 13.0–17.0)
LYMPHS PCT: 26.6 % (ref 12.0–46.0)
Lymphs Abs: 2.4 10*3/uL (ref 0.7–4.0)
MCHC: 31.5 g/dL (ref 30.0–36.0)
MCV: 68.3 fl — ABNORMAL LOW (ref 78.0–100.0)
MONO ABS: 0.9 10*3/uL (ref 0.1–1.0)
Monocytes Relative: 10.2 % (ref 3.0–12.0)
Neutro Abs: 5.6 10*3/uL (ref 1.4–7.7)
Neutrophils Relative %: 61.5 % (ref 43.0–77.0)
Platelets: 385 10*3/uL (ref 150.0–400.0)
RBC: 5.86 Mil/uL — AB (ref 4.22–5.81)
RDW: 16.2 % — AB (ref 11.5–15.5)
WBC: 9.2 10*3/uL (ref 4.0–10.5)

## 2015-04-04 LAB — URINALYSIS, ROUTINE W REFLEX MICROSCOPIC
BILIRUBIN URINE: NEGATIVE
LEUKOCYTES UA: NEGATIVE
Nitrite: NEGATIVE
SPECIFIC GRAVITY, URINE: 1.02 (ref 1.000–1.030)
Total Protein, Urine: NEGATIVE
URINE GLUCOSE: NEGATIVE
UROBILINOGEN UA: 0.2 (ref 0.0–1.0)
pH: 6 (ref 5.0–8.0)

## 2015-04-05 LAB — LIPID PANEL
CHOL/HDL RATIO: 4
Cholesterol: 132 mg/dL (ref 0–200)
HDL: 33.4 mg/dL — AB (ref >39.00)
LDL Cholesterol: 60 mg/dL (ref 0–99)
NonHDL: 98.48
TRIGLYCERIDES: 190 mg/dL — AB (ref 0.0–149.0)
VLDL: 38 mg/dL (ref 0.0–40.0)

## 2015-04-05 LAB — HEPATIC FUNCTION PANEL
ALK PHOS: 67 U/L (ref 39–117)
ALT: 21 U/L (ref 0–53)
AST: 19 U/L (ref 0–37)
Albumin: 4.1 g/dL (ref 3.5–5.2)
BILIRUBIN DIRECT: 0 mg/dL (ref 0.0–0.3)
TOTAL PROTEIN: 7.8 g/dL (ref 6.0–8.3)
Total Bilirubin: 0.3 mg/dL (ref 0.2–1.2)

## 2015-04-05 LAB — BASIC METABOLIC PANEL
BUN: 13 mg/dL (ref 6–23)
CALCIUM: 9.2 mg/dL (ref 8.4–10.5)
CHLORIDE: 103 meq/L (ref 96–112)
CO2: 31 mEq/L (ref 19–32)
CREATININE: 1.29 mg/dL (ref 0.40–1.50)
GFR: 77.47 mL/min (ref >60.00)
Glucose, Bld: 106 mg/dL — ABNORMAL HIGH (ref 70–99)
Potassium: 4.1 mEq/L (ref 3.5–5.1)
Sodium: 140 mEq/L (ref 135–145)

## 2015-04-05 LAB — PSA: PSA: 1.36 ng/mL (ref 0.10–4.00)

## 2015-04-09 ENCOUNTER — Encounter: Payer: Self-pay | Admitting: Internal Medicine

## 2015-04-09 ENCOUNTER — Ambulatory Visit (INDEPENDENT_AMBULATORY_CARE_PROVIDER_SITE_OTHER): Payer: BC Managed Care – PPO | Admitting: Internal Medicine

## 2015-04-09 ENCOUNTER — Other Ambulatory Visit (INDEPENDENT_AMBULATORY_CARE_PROVIDER_SITE_OTHER): Payer: BC Managed Care – PPO

## 2015-04-09 VITALS — BP 138/82 | HR 101 | Temp 98.4°F | Resp 20 | Ht 63.0 in | Wt 183.0 lb

## 2015-04-09 DIAGNOSIS — R229 Localized swelling, mass and lump, unspecified: Secondary | ICD-10-CM | POA: Diagnosis not present

## 2015-04-09 DIAGNOSIS — K5090 Crohn's disease, unspecified, without complications: Secondary | ICD-10-CM | POA: Diagnosis not present

## 2015-04-09 DIAGNOSIS — R109 Unspecified abdominal pain: Secondary | ICD-10-CM | POA: Diagnosis not present

## 2015-04-09 DIAGNOSIS — R3129 Other microscopic hematuria: Secondary | ICD-10-CM | POA: Diagnosis not present

## 2015-04-09 DIAGNOSIS — Z0000 Encounter for general adult medical examination without abnormal findings: Secondary | ICD-10-CM

## 2015-04-09 DIAGNOSIS — I10 Essential (primary) hypertension: Secondary | ICD-10-CM

## 2015-04-09 LAB — URINALYSIS, ROUTINE W REFLEX MICROSCOPIC
Bilirubin Urine: NEGATIVE
Leukocytes, UA: NEGATIVE
Nitrite: NEGATIVE
Specific Gravity, Urine: 1.025 (ref 1.000–1.030)
Urine Glucose: NEGATIVE
Urobilinogen, UA: 0.2 (ref 0.0–1.0)
pH: 6 (ref 5.0–8.0)

## 2015-04-09 MED ORDER — FEXOFENADINE HCL 180 MG PO TABS
180.0000 mg | ORAL_TABLET | Freq: Every day | ORAL | Status: DC
Start: 2015-04-09 — End: 2017-10-06

## 2015-04-09 MED ORDER — AMLODIPINE-OLMESARTAN 5-40 MG PO TABS: 1.0000 | ORAL_TABLET | Freq: Every day | ORAL | Status: DC

## 2015-04-09 MED ORDER — ASPIRIN EC 81 MG PO TBEC: 81.0000 mg | DELAYED_RELEASE_TABLET | Freq: Every day | ORAL | Status: DC

## 2015-04-09 NOTE — Assessment & Plan Note (Signed)
New onset or as new noticed in last few months, not getting larger, likely benign skin related such as dermatofibroma, ok to cont to monitor for now but to gen surgury if enlarges

## 2015-04-09 NOTE — Assessment & Plan Note (Signed)

## 2015-04-09 NOTE — Progress Notes (Signed)
Subjective:    Patient ID: Bradley Donaldson, male    DOB: Mar 22, 1970, 45 y.o.   MRN: [id number]  HPI  Here for wellness and f/u;  Overall doing ok;  Pt denies Chest pain, worsening SOB, DOE, wheezing, orthopnea, PND, worsening LE edema, palpitations, dizziness or syncope.  Pt denies neurological change such as new headache, facial or extremity weakness.  Pt denies polydipsia, polyuria, or low sugar symptoms. Pt states overall good compliance with treatment and medications, good tolerability, and has been trying to follow appropriate diet.  Pt denies worsening depressive symptoms, suicidal ideation or panic. No fever, night sweats, wt loss, loss of appetite, or other constitutional symptoms.  Pt states good ability with ADL's, has low fall risk, home safety reviewed and adequate, no other significant changes in hearing or vision, and only occasionally active with exercise. Decliens immunizations.  Does have 3 mo recurring left flank pain, really only symptomatic after lying on the left side, also has a new lump under the skin asympt until after lying on the left side so not sure if related.  No gross hematuriua, and Denies urinary symptoms such as dysuria, frequency, urgency, flank pain, hematuria or n/v, fever, chills.  No hx of renal stone or malignancy  Also mentions incresaed diarrhea recently without abd pain or blood, has not seen GI for several yrs. Denies worsening reflux, abd pain, dysphagia, n/v, bowel change or blood.  Currently in between jobs.   Past Medical History  Diagnosis Date  . ANEMIA-IRON DEFICIENCY 03/18/2007  . HYPERTENSION 01/04/2007  . URI 12/09/2007  . ALLERGIC RHINITIS 03/18/2007  . CROHN'S DISEASE 01/04/2007  . DERMATITIS, SCALP 12/09/2007  . Incisional hernia 06/18/2010   Past Surgical History  Procedure Laterality Date  . Partial colon removal  1998    Right hemicolectomy    reports that he has never smoked. He has never used smokeless tobacco. He reports that he drinks  alcohol. He reports that he does not use illicit drugs. family history includes Heart disease in his father; Hypertension in his other. Allergies  Allergen Reactions  . Ranitidine Hcl    Current Outpatient Prescriptions on File Prior to Visit  Medication Sig Dispense Refill  . fluocinolone (SYNALAR) 0.01 % external solution Apply topically 2 (two) times daily. Applied 10 drops to the scalp after shampooing once daily 60 mL 0   No current facility-administered medications on file prior to visit.   Review of Systems Constitutional: Negative for increased diaphoresis, other activity, appetite or siginficant weight change other than noted HENT: Negative for worsening hearing loss, ear pain, facial swelling, mouth sores and neck stiffness.   Eyes: Negative for other worsening pain, redness or visual disturbance.  Respiratory: Negative for shortness of breath and wheezing  Cardiovascular: Negative for chest pain and palpitations.  Gastrointestinal: Negative for diarrhea, blood in stool, abdominal distention or other pain Genitourinary: Negative for hematuria, flank pain or change in urine volume.  Musculoskeletal: Negative for myalgias or other joint complaints.  Skin: Negative for color change and wound or drainage.  Neurological: Negative for syncope and numbness. other than noted Hematological: Negative for adenopathy. or other swelling Psychiatric/Behavioral: Negative for hallucinations, SI, self-injury, decreased concentration or other worsening agitation.      Objective:   Physical Exam BP 138/82 mmHg  Pulse 101  Temp(Src) 98.4 F (36.9 C) (Oral)  Resp 20  Ht 5\' 3"  (1.6 m)  Wt 183 lb (83.008 kg)  BMI 32.43 kg/m2  SpO2 96% VS noted,  Constitutional:  Pt is oriented to person, place, and time. Appears well-developed and well-nourished, in no significant distress Head: Normocephalic and atraumatic.  Right Ear: External ear normal.  Left Ear: External ear normal.  Nose: Nose  normal.  Mouth/Throat: Oropharynx is clear and moist.  Eyes: Conjunctivae and EOM are normal. Pupils are equal, round, and reactive to light.  Neck: Normal range of motion. Neck supple. No JVD present. No tracheal deviation present or significant neck LA or mass Cardiovascular: Normal rate, regular rhythm, normal heart sounds and intact distal pulses.   Pulmonary/Chest: Effort normal and breath sounds without rales or wheezing  Abdominal: Soft. Bowel sounds are normal. NT. No HSM  Musculoskeletal: Normal range of motion. Exhibits no edema.  Lymphadenopathy:  Has no cervical adenopathy.  Neurological: Pt is alert and oriented to person, place, and time. Pt has normal reflexes. No cranial nerve deficit. Motor grossly intact Skin: Skin is warm and dry. No rash noted. , does have subq nodular lump approx 2 cm to left flank area, firm, nontender, mobile without tenderness or overlying skin change Psychiatric:  Has normal mood and affect. Behavior is normal.     Assessment & Plan:

## 2015-04-09 NOTE — Assessment & Plan Note (Signed)
Etiology unclear, for f/u ua and culture today, CT scan without CM - r/o renal stones, and refer urology

## 2015-04-09 NOTE — Assessment & Plan Note (Signed)
With some diarrhea without pain or blood recently, for Gi referral , has not been seen in many years

## 2015-04-09 NOTE — Patient Instructions (Addendum)
Please start Aspirin 81 mg - 1 per day - OTC (coated only)  Please continue all other medications as before, and refills have been done if requested.  Please have the pharmacy call with any other refills you may need.  Please continue your efforts at being more active, low cholesterol diet, and weight control.  You are otherwise up to date with prevention measures today.  Please keep your appointments with your specialists as you may have planned  You will be contacted regarding the referral for: CT scan, Urology, and Gastroenterology  Please call for General Surgury referral if you feel the lump to the left flank is increasing in size in the next few months  Please go to the LAB in the Basement (turn left off the elevator) for the tests to be done today - just the urine testing today  You will be contacted by phone if any changes need to be made immediately.  Otherwise, you will receive a letter about your results with an explanation, but please check with MyChart first.  Please remember to sign up for MyChart if you have not done so, as this will be important to you in the future with finding out test results, communicating by private email, and scheduling acute appointments online when needed.  Please return in 1 year for your yearly visit, or sooner if needed, with Lab testing done 3-5 days before

## 2015-04-09 NOTE — Assessment & Plan Note (Signed)
stable overall by history and exam, recent data reviewed with pt, and pt to continue medical treatment as before,  to f/u any worsening symptoms or concerns BP Readings from Last 3 Encounters:  04/09/15 138/82  12/11/14 136/90  03/13/14 140/88

## 2015-04-09 NOTE — Progress Notes (Signed)
Pre visit review using our clinic review tool, if applicable. No additional management support is needed unless otherwise documented below in the visit note. 

## 2015-04-09 NOTE — Assessment & Plan Note (Signed)
?   Renal stone related vs msk vs subq nodule related? For eval as mentioned

## 2015-04-10 LAB — URINE CULTURE
Colony Count: NO GROWTH
Organism ID, Bacteria: NO GROWTH

## 2015-04-22 ENCOUNTER — Ambulatory Visit (INDEPENDENT_AMBULATORY_CARE_PROVIDER_SITE_OTHER)
Admission: RE | Admit: 2015-04-22 | Discharge: 2015-04-22 | Disposition: A | Discharge status: Home / Self Care | Payer: BC Managed Care – PPO | Source: Ambulatory Visit | Attending: Internal Medicine | Admitting: Internal Medicine

## 2015-04-22 DIAGNOSIS — R3129 Other microscopic hematuria: Secondary | ICD-10-CM | POA: Diagnosis not present

## 2015-04-22 DIAGNOSIS — R109 Unspecified abdominal pain: Secondary | ICD-10-CM | POA: Diagnosis not present

## 2015-04-30 ENCOUNTER — Other Ambulatory Visit: Payer: Self-pay | Admitting: Internal Medicine

## 2015-11-11 ENCOUNTER — Ambulatory Visit (HOSPITAL_COMMUNITY)
Admission: EM | Admit: 2015-11-11 | Discharge: 2015-11-11 | Disposition: A | Discharge status: Home / Self Care | Payer: BC Managed Care – PPO

## 2015-11-11 ENCOUNTER — Encounter (HOSPITAL_COMMUNITY): Payer: Self-pay | Admitting: Emergency Medicine

## 2015-11-11 DIAGNOSIS — R599 Enlarged lymph nodes, unspecified: Secondary | ICD-10-CM

## 2015-11-11 DIAGNOSIS — R591 Generalized enlarged lymph nodes: Secondary | ICD-10-CM | POA: Diagnosis not present

## 2015-11-11 NOTE — Discharge Instructions (Signed)
Take motrin, aleve or ibuprofen as directed on the back of the box. Apply heat to affected area.

## 2015-11-11 NOTE — ED Provider Notes (Signed)
CSN: [id number]     Arrival date & time 11/11/15  1218 History   None    Chief Complaint  Patient presents with  . Lymphadenopathy   (Consider location/radiation/quality/duration/timing/severity/associated sxs/prior Treatment) 45 y.o. male  History of  Iron deficiency anemia,. crohns disease and htn presents with right sided submandibular lymph node swelling  X 1 day. Patient denies any fevers, recent illness, sinus congestion or ear pain Condition is acute in nature. Condition is made better by nothing. Condition is made worse by eating. Patient denies any treatment prior to there arrival at this facility.       Past Medical History:  Diagnosis Date  . ALLERGIC RHINITIS 03/18/2007  . ANEMIA-IRON DEFICIENCY 03/18/2007  . CROHN'S DISEASE 01/04/2007  . DERMATITIS, SCALP 12/09/2007  . HYPERTENSION 01/04/2007  . Incisional hernia 06/18/2010  . URI 12/09/2007   Past Surgical History:  Procedure Laterality Date  . partial colon removal  1998   Right hemicolectomy   Family History  Problem Relation Age of Onset  . Hypertension Other   . Heart disease Father    Social History  Substance Use Topics  . Smoking status: Never Smoker  . Smokeless tobacco: Never Used  . Alcohol use Yes     Comment: occasional    Review of Systems  Allergies  Ranitidine hcl  Home Medications   Prior to Admission medications   Medication Sig Start Date End Date Taking? Authorizing Provider  amLODipine-olmesartan (AZOR) 5-40 MG tablet Take 1 tablet by mouth daily. Must keep 04/09/15 appt for refills 04/09/15  Yes Biagio Borg, MD  aspirin EC 81 MG tablet Take 1 tablet (81 mg total) by mouth daily. 04/09/15   Biagio Borg, MD  AZOR 5-40 MG tablet TAKE 1 TABLET BY MOUTH DAILY. 05/01/15   Biagio Borg, MD  fexofenadine (ALLEGRA) 180 MG tablet Take 1 tablet (180 mg total) by mouth daily. 04/09/15   Biagio Borg, MD  fluocinolone (SYNALAR) 0.01 % external solution Apply topically 2 (two) times daily. Applied  10 drops to the scalp after shampooing once daily 01/07/14   Darlyne Russian, MD   Meds Ordered and Administered this Visit  Medications - No data to display  BP 131/82 (BP Location: Left Arm)   Pulse 77   Temp 99 F (37.2 C) (Oral)   Resp 16   SpO2 100%  No data found.   Physical Exam  Urgent Care Course   Clinical Course    Procedures (including critical care time)  Labs Review Labs Reviewed - No data to display  Imaging Review No results found.   Visual Acuity Review  Right Eye Distance:   Left Eye Distance:   Bilateral Distance:    Right Eye Near:   Left Eye Near:    Bilateral Near:         MDM   1. Swelling of lymph node       Jacqualine Mau, NP 11/11/15 1441

## 2015-11-11 NOTE — ED Triage Notes (Signed)
The patient presented to the [facility] with a complaint of a possible swollen gland on his neck that he noticed yesterday.

## 2016-02-11 ENCOUNTER — Other Ambulatory Visit: Payer: Self-pay | Admitting: Internal Medicine

## 2016-02-21 ENCOUNTER — Other Ambulatory Visit: Payer: BC Managed Care – PPO

## 2016-02-25 ENCOUNTER — Other Ambulatory Visit (INDEPENDENT_AMBULATORY_CARE_PROVIDER_SITE_OTHER): Payer: BC Managed Care – PPO

## 2016-02-25 DIAGNOSIS — Z0000 Encounter for general adult medical examination without abnormal findings: Secondary | ICD-10-CM

## 2016-02-25 LAB — URINALYSIS, ROUTINE W REFLEX MICROSCOPIC
Bilirubin Urine: NEGATIVE
Ketones, ur: NEGATIVE
LEUKOCYTES UA: NEGATIVE
Nitrite: NEGATIVE
SPECIFIC GRAVITY, URINE: 1.025 (ref 1.000–1.030)
TOTAL PROTEIN, URINE-UPE24: NEGATIVE
Urine Glucose: NEGATIVE
Urobilinogen, UA: 0.2 (ref 0.0–1.0)
WBC, UA: NONE SEEN (ref 0–?)
pH: 5.5 (ref 5.0–8.0)

## 2016-02-25 LAB — LIPID PANEL
CHOLESTEROL: 129 mg/dL (ref 0–200)
HDL: 34.3 mg/dL — ABNORMAL LOW (ref >39.00)
LDL CALC: 63 mg/dL (ref 0–99)
NONHDL: 95
Total CHOL/HDL Ratio: 4
Triglycerides: 160 mg/dL — ABNORMAL HIGH (ref 0.0–149.0)
VLDL: 32 mg/dL (ref 0.0–40.0)

## 2016-02-25 LAB — HEPATIC FUNCTION PANEL
ALK PHOS: 70 U/L (ref 39–117)
ALT: 20 U/L (ref 0–53)
AST: 17 U/L (ref 0–37)
Albumin: 4.2 g/dL (ref 3.5–5.2)
BILIRUBIN DIRECT: 0.1 mg/dL (ref 0.0–0.3)
Total Bilirubin: 0.4 mg/dL (ref 0.2–1.2)
Total Protein: 7.3 g/dL (ref 6.0–8.3)

## 2016-02-25 LAB — CBC WITH DIFFERENTIAL/PLATELET
BASOS PCT: 0.5 % (ref 0.0–3.0)
Basophils Absolute: 0.1 10*3/uL (ref 0.0–0.1)
EOS PCT: 1.7 % (ref 0.0–5.0)
Eosinophils Absolute: 0.2 10*3/uL (ref 0.0–0.7)
HCT: 38.6 % — ABNORMAL LOW (ref 39.0–52.0)
Hemoglobin: 12.5 g/dL — ABNORMAL LOW (ref 13.0–17.0)
LYMPHS ABS: 3.5 10*3/uL (ref 0.7–4.0)
Lymphocytes Relative: 35.2 % (ref 12.0–46.0)
MCHC: 32.4 g/dL (ref 30.0–36.0)
MONO ABS: 1.2 10*3/uL — AB (ref 0.1–1.0)
MONOS PCT: 12.6 % — AB (ref 3.0–12.0)
NEUTROS ABS: 4.9 10*3/uL (ref 1.4–7.7)
NEUTROS PCT: 50 % (ref 43.0–77.0)
PLATELETS: 365 10*3/uL (ref 150.0–400.0)
RBC: 5.67 Mil/uL (ref 4.22–5.81)
RDW: 16.8 % — AB (ref 11.5–15.5)
WBC: 9.9 10*3/uL (ref 4.0–10.5)

## 2016-02-25 LAB — BASIC METABOLIC PANEL
BUN: 12 mg/dL (ref 6–23)
CALCIUM: 9 mg/dL (ref 8.4–10.5)
CO2: 27 mEq/L (ref 19–32)
Chloride: 103 mEq/L (ref 96–112)
Creatinine, Ser: 1.19 mg/dL (ref 0.40–1.50)
GFR: 84.69 mL/min (ref >60.00)
GLUCOSE: 83 mg/dL (ref 70–99)
POTASSIUM: 4 meq/L (ref 3.5–5.1)
SODIUM: 138 meq/L (ref 135–145)

## 2016-02-25 LAB — TSH: TSH: 1.29 u[IU]/mL (ref 0.35–4.50)

## 2016-02-25 LAB — PSA: PSA: 1.93 ng/mL (ref 0.10–4.00)

## 2016-02-27 ENCOUNTER — Ambulatory Visit (INDEPENDENT_AMBULATORY_CARE_PROVIDER_SITE_OTHER): Payer: BC Managed Care – PPO | Admitting: Internal Medicine

## 2016-02-27 ENCOUNTER — Encounter: Payer: Self-pay | Admitting: Internal Medicine

## 2016-02-27 VITALS — BP 130/70 | HR 94 | Temp 98.5°F | Resp 20 | Wt 176.0 lb

## 2016-02-27 DIAGNOSIS — N529 Male erectile dysfunction, unspecified: Secondary | ICD-10-CM

## 2016-02-27 DIAGNOSIS — I10 Essential (primary) hypertension: Secondary | ICD-10-CM | POA: Diagnosis not present

## 2016-02-27 DIAGNOSIS — Z23 Encounter for immunization: Secondary | ICD-10-CM

## 2016-02-27 DIAGNOSIS — Z0000 Encounter for general adult medical examination without abnormal findings: Secondary | ICD-10-CM | POA: Diagnosis not present

## 2016-02-27 MED ORDER — AMLODIPINE-OLMESARTAN 5-40 MG PO TABS: 1.0000 | ORAL_TABLET | Freq: Every day | ORAL | 3 refills | Status: DC

## 2016-02-27 MED ORDER — SILDENAFIL CITRATE 100 MG PO TABS: 50.0000 mg | ORAL_TABLET | Freq: Every day | ORAL | 11 refills | Status: DC | PRN

## 2016-02-27 NOTE — Progress Notes (Signed)
Subjective:    Patient ID: Bradley Donaldson, male    DOB: 08/03/1970, 45 y.o.   MRN: [id number]  HPI  Here for wellness and f/u;  Overall doing ok;  Pt denies Chest pain, worsening SOB, DOE, wheezing, orthopnea, PND, worsening LE edema, palpitations, dizziness or syncope.  Pt denies neurological change such as new headache, facial or extremity weakness.  Pt denies polydipsia, polyuria, or low sugar symptoms. Pt states overall good compliance with treatment and medications, good tolerability, and has been trying to follow appropriate diet.  Pt denies worsening depressive symptoms, suicidal ideation or panic. No fever, night sweats, wt loss, loss of appetite, or other constitutional symptoms.  Pt states good ability with ADL's, has low fall risk, home safety reviewed and adequate, no other significant changes in hearing or vision, and only occasionally active with exercise. No other new history findings except mild worsening ED symptoms over the past 6 mo. Past Medical History:  Diagnosis Date  . ALLERGIC RHINITIS 03/18/2007  . ANEMIA-IRON DEFICIENCY 03/18/2007  . CROHN'S DISEASE 01/04/2007  . DERMATITIS, SCALP 12/09/2007  . HYPERTENSION 01/04/2007  . Incisional hernia 06/18/2010  . URI 12/09/2007   Past Surgical History:  Procedure Laterality Date  . partial colon removal  1998   Right hemicolectomy    reports that he has never smoked. He has never used smokeless tobacco. He reports that he drinks alcohol. He reports that he does not use drugs. family history includes Heart disease in his father; Hypertension in his other. Allergies  Allergen Reactions  . Ranitidine Hcl    Current Outpatient Prescriptions on File Prior to Visit  Medication Sig Dispense Refill  . aspirin EC 81 MG tablet Take 1 tablet (81 mg total) by mouth daily. 90 tablet 11  . fexofenadine (ALLEGRA) 180 MG tablet Take 1 tablet (180 mg total) by mouth daily. 90 tablet 3  . fluocinolone (SYNALAR) 0.01 % external solution Apply  topically 2 (two) times daily. Applied 10 drops to the scalp after shampooing once daily 60 mL 0   No current facility-administered medications on file prior to visit.    Review of Systems Constitutional: Negative for increased diaphoresis, or other activity, appetite or siginficant weight change other than noted HENT: Negative for worsening hearing loss, ear pain, facial swelling, mouth sores and neck stiffness.   Eyes: Negative for other worsening pain, redness or visual disturbance.  Respiratory: Negative for choking or stridor Cardiovascular: Negative for other chest pain and palpitations.  Gastrointestinal: Negative for worsening diarrhea, blood in stool, or abdominal distention Genitourinary: Negative for hematuria, flank pain or change in urine volume.  Musculoskeletal: Negative for myalgias or other joint complaints.  Skin: Negative for other color change and wound or drainage.  Neurological: Negative for syncope and numbness. other than noted Hematological: Negative for adenopathy. or other swelling Psychiatric/Behavioral: Negative for hallucinations, SI, self-injury, decreased concentration or other worsening agitation.  All other system neg per pt    Objective:   Physical Exam BP 130/70   Pulse 94   Temp 98.5 F (36.9 C) (Oral)   Resp 20   Wt 176 lb (79.8 kg)   SpO2 97%   BMI 31.18 kg/m  VS noted,  Constitutional: Pt is oriented to person, place, and time. Appears well-developed and well-nourished, in no significant distress Head: Normocephalic and atraumatic  Eyes: Conjunctivae and EOM are normal. Pupils are equal, round, and reactive to light Right Ear: External ear normal.  Left Ear: External ear normal Nose: Nose normal.  Mouth/Throat: Oropharynx is clear and moist  Neck: Normal range of motion. Neck supple. No JVD present. No tracheal deviation present or significant neck LA or mass Cardiovascular: Normal rate, regular rhythm, normal heart sounds and intact  distal pulses.   Pulmonary/Chest: Effort normal and breath sounds without rales or wheezing  Abdominal: Soft. Bowel sounds are normal. NT. No HSM  Musculoskeletal: Normal range of motion. Exhibits no edema Lymphadenopathy: Has no cervical adenopathy.  Neurological: Pt is alert and oriented to person, place, and time. Pt has normal reflexes. No cranial nerve deficit. Motor grossly intact Skin: Skin is warm and dry. No rash noted or new ulcers Psychiatric:  Has normal mood and affect. Behavior is normal.  No other new exam findings    Assessment & Plan:

## 2016-02-27 NOTE — Progress Notes (Signed)
Pre visit review using our clinic review tool, if applicable. No additional management support is needed unless otherwise documented below in the visit note. 

## 2016-02-27 NOTE — Patient Instructions (Addendum)
You had the tetanus (Tdap) and flu shots today  Please take all new medication as prescribed - the viagra generic  Please continue all other medications as before, and refills have been done if requested.  Please have the pharmacy call with any other refills you may need.  Please continue your efforts at being more active, low cholesterol diet, and weight control.  You are otherwise up to date with prevention measures today.  Please keep your appointments with your specialists as you may have planned  Please return in 1 year for your yearly visit, or sooner if needed, with Lab testing done 3-5 days before

## 2016-02-29 NOTE — Assessment & Plan Note (Signed)
D/w pt, for generic viagra prn,  to f/u any worsening symptoms or concerns

## 2016-02-29 NOTE — Assessment & Plan Note (Signed)

## 2016-02-29 NOTE — Assessment & Plan Note (Signed)
stable overall by history and exam, recent data reviewed with pt, and pt to continue medical treatment as before,  to f/u any worsening symptoms or concerns BP Readings from Last 3 Encounters:  02/27/16 130/70  11/11/15 131/82  04/09/15 138/82

## 2017-03-09 HISTORY — PX: COLONOSCOPY: SHX174

## 2017-03-12 ENCOUNTER — Other Ambulatory Visit: Payer: Self-pay | Admitting: Internal Medicine

## 2017-03-19 ENCOUNTER — Other Ambulatory Visit (INDEPENDENT_AMBULATORY_CARE_PROVIDER_SITE_OTHER): Payer: BC Managed Care – PPO

## 2017-03-19 ENCOUNTER — Ambulatory Visit (INDEPENDENT_AMBULATORY_CARE_PROVIDER_SITE_OTHER): Payer: BC Managed Care – PPO | Admitting: Internal Medicine

## 2017-03-19 ENCOUNTER — Encounter: Payer: Self-pay | Admitting: Internal Medicine

## 2017-03-19 VITALS — BP 144/88 | HR 77 | Temp 98.2°F | Ht 63.0 in | Wt 185.0 lb

## 2017-03-19 DIAGNOSIS — R739 Hyperglycemia, unspecified: Secondary | ICD-10-CM | POA: Diagnosis not present

## 2017-03-19 DIAGNOSIS — Z0000 Encounter for general adult medical examination without abnormal findings: Secondary | ICD-10-CM | POA: Diagnosis not present

## 2017-03-19 DIAGNOSIS — I10 Essential (primary) hypertension: Secondary | ICD-10-CM

## 2017-03-19 LAB — CBC WITH DIFFERENTIAL/PLATELET
BASOS ABS: 0 10*3/uL (ref 0.0–0.1)
Basophils Relative: 0.5 % (ref 0.0–3.0)
Eosinophils Absolute: 0.1 10*3/uL (ref 0.0–0.7)
Eosinophils Relative: 1.3 % (ref 0.0–5.0)
HCT: 40.6 % (ref 39.0–52.0)
Hemoglobin: 12.7 g/dL — ABNORMAL LOW (ref 13.0–17.0)
LYMPHS ABS: 2.5 10*3/uL (ref 0.7–4.0)
Lymphocytes Relative: 31.1 % (ref 12.0–46.0)
MCHC: 31.3 g/dL (ref 30.0–36.0)
MCV: 68.7 fl — ABNORMAL LOW (ref 78.0–100.0)
MONOS PCT: 8 % (ref 3.0–12.0)
Monocytes Absolute: 0.6 10*3/uL (ref 0.1–1.0)
NEUTROS PCT: 59.1 % (ref 43.0–77.0)
Neutro Abs: 4.7 10*3/uL (ref 1.4–7.7)
Platelets: 360 10*3/uL (ref 150.0–400.0)
RBC: 5.92 Mil/uL — AB (ref 4.22–5.81)
RDW: 16.7 % — ABNORMAL HIGH (ref 11.5–15.5)
WBC: 8 10*3/uL (ref 4.0–10.5)

## 2017-03-19 LAB — BASIC METABOLIC PANEL
BUN: 12 mg/dL (ref 6–23)
CALCIUM: 9.4 mg/dL (ref 8.4–10.5)
CO2: 30 meq/L (ref 19–32)
CREATININE: 1.07 mg/dL (ref 0.40–1.50)
Chloride: 99 mEq/L (ref 96–112)
GFR: 95.3 mL/min (ref >60.00)
Glucose, Bld: 90 mg/dL (ref 70–99)
Potassium: 4.2 mEq/L (ref 3.5–5.1)
Sodium: 138 mEq/L (ref 135–145)

## 2017-03-19 LAB — LIPID PANEL
CHOL/HDL RATIO: 3
Cholesterol: 140 mg/dL (ref 0–200)
HDL: 42 mg/dL (ref >39.00)
LDL Cholesterol: 74 mg/dL (ref 0–99)
NONHDL: 97.81
Triglycerides: 119 mg/dL (ref 0.0–149.0)
VLDL: 23.8 mg/dL (ref 0.0–40.0)

## 2017-03-19 LAB — URINALYSIS, ROUTINE W REFLEX MICROSCOPIC
Bilirubin Urine: NEGATIVE
Ketones, ur: NEGATIVE
Leukocytes, UA: NEGATIVE
NITRITE: NEGATIVE
PH: 5.5 (ref 5.0–8.0)
RBC / HPF: NONE SEEN (ref 0–?)
SPECIFIC GRAVITY, URINE: 1.01 (ref 1.000–1.030)
TOTAL PROTEIN, URINE-UPE24: NEGATIVE
URINE GLUCOSE: NEGATIVE
Urobilinogen, UA: 0.2 (ref 0.0–1.0)

## 2017-03-19 LAB — PSA: PSA: 1.77 ng/mL (ref 0.10–4.00)

## 2017-03-19 LAB — HEPATIC FUNCTION PANEL
ALT: 21 U/L (ref 0–53)
AST: 16 U/L (ref 0–37)
Albumin: 4.5 g/dL (ref 3.5–5.2)
Alkaline Phosphatase: 67 U/L (ref 39–117)
BILIRUBIN TOTAL: 0.6 mg/dL (ref 0.2–1.2)
Bilirubin, Direct: 0.1 mg/dL (ref 0.0–0.3)
Total Protein: 8.1 g/dL (ref 6.0–8.3)

## 2017-03-19 LAB — HEMOGLOBIN A1C: Hgb A1c MFr Bld: 6.3 % (ref 4.6–6.5)

## 2017-03-19 LAB — TSH: TSH: 0.89 u[IU]/mL (ref 0.35–4.50)

## 2017-03-19 MED ORDER — HYDROCHLOROTHIAZIDE 25 MG PO TABS: 12.5000 mg | ORAL_TABLET | Freq: Every day | ORAL | 3 refills | Status: DC

## 2017-03-19 MED ORDER — AMLODIPINE-OLMESARTAN 5-40 MG PO TABS: 1.0000 | ORAL_TABLET | Freq: Every day | ORAL | 3 refills | Status: DC

## 2017-03-19 NOTE — Progress Notes (Signed)
Subjective:    Patient ID: Bradley Donaldson, male    DOB: [birth year], 47 y.o.   MRN: [id number]  HPI  Here for wellness and f/u;  Overall doing ok;  Pt denies Chest pain, worsening SOB, DOE, wheezing, orthopnea, PND, worsening LE edema, palpitations, dizziness or syncope.  Pt denies neurological change such as new headache, facial or extremity weakness.  Pt denies polydipsia, polyuria, or low sugar symptoms. Pt states overall good compliance with treatment and medications, good tolerability, and has been trying to follow appropriate diet.  Pt denies worsening depressive symptoms, suicidal ideation or panic. No fever, night sweats, wt loss, loss of appetite, or other constitutional symptoms.  Pt states good ability with ADL's, has low fall risk, home safety reviewed and adequate, no other significant changes in hearing or vision, and only occasionally active with exercise.  Declines flu shot for now. BP Readings from Last 3 Encounters:  03/19/17 (!) 144/88  02/27/16 130/70  11/11/15 131/82   Wt Readings from Last 3 Encounters:  03/19/17 185 lb (83.9 kg)  02/27/16 176 lb (79.8 kg)  04/09/15 183 lb (83 kg)  has gained wt with less active in the past yr.  Has some intermittent left thumb pain at the DIP which pops sometimes, better with wearing a wrist brace  No other interval hx or new complaints Past Medical History:  Diagnosis Date  . ALLERGIC RHINITIS 03/18/2007  . ANEMIA-IRON DEFICIENCY 03/18/2007  . CROHN'S DISEASE 01/04/2007  . DERMATITIS, SCALP 12/09/2007  . HYPERTENSION 01/04/2007  . Incisional hernia 06/18/2010  . URI 12/09/2007   Past Surgical History:  Procedure Laterality Date  . partial colon removal  1998   Right hemicolectomy    reports that  has never smoked. he has never used smokeless tobacco. He reports that he drinks alcohol. He reports that he does not use drugs. family history includes Heart disease in his father; Hypertension in his other. Allergies  Allergen Reactions  .  Ranitidine Hcl    Current Outpatient Medications on File Prior to Visit  Medication Sig Dispense Refill  . aspirin EC 81 MG tablet Take 1 tablet (81 mg total) by mouth daily. 90 tablet 11  . fexofenadine (ALLEGRA) 180 MG tablet Take 1 tablet (180 mg total) by mouth daily. 90 tablet 3  . fluocinolone (SYNALAR) 0.01 % external solution Apply topically 2 (two) times daily. Applied 10 drops to the scalp after shampooing once daily 60 mL 0   No current facility-administered medications on file prior to visit.    Review of Systems Constitutional: Negative for other unusual diaphoresis, sweats, appetite or weight changes HENT: Negative for other worsening hearing loss, ear pain, facial swelling, mouth sores or neck stiffness.   Eyes: Negative for other worsening pain, redness or other visual disturbance.  Respiratory: Negative for other stridor or swelling Cardiovascular: Negative for other palpitations or other chest pain  Gastrointestinal: Negative for worsening diarrhea or loose stools, blood in stool, distention or other pain Genitourinary: Negative for hematuria, flank pain or other change in urine volume.  Musculoskeletal: Negative for myalgias or other joint swelling.  Skin: Negative for other color change, or other wound or worsening drainage.  Neurological: Negative for other syncope or numbness. Hematological: Negative for other adenopathy or swelling Psychiatric/Behavioral: Negative for hallucinations, other worsening agitation, SI, self-injury, or new decreased concentration All other system neg per pt    Objective:   Physical Exam BP (!) 144/88   Pulse 77   Temp 98.2 F (36.8  C) (Oral)   Ht 5\' 3"  (1.6 m)   Wt 185 lb (83.9 kg)   SpO2 98%   BMI 32.77 kg/m  VS noted,  Constitutional: Pt is oriented to person, place, and time. Appears well-developed and well-nourished, in no significant distress and comfortable Head: Normocephalic and atraumatic  Eyes: Conjunctivae and EOM  are normal. Pupils are equal, round, and reactive to light Right Ear: External ear normal without discharge Left Ear: External ear normal without discharge Nose: Nose without discharge or deformity Mouth/Throat: Oropharynx is without other ulcerations and moist  Neck: Normal range of motion. Neck supple. No JVD present. No tracheal deviation present or significant neck LA or mass Cardiovascular: Normal rate, regular rhythm, normal heart sounds and intact distal pulses.   Pulmonary/Chest: WOB normal and breath sounds without rales or wheezing  Abdominal: Soft. Bowel sounds are normal. NT. No HSM  Musculoskeletal: Normal range of motion. Exhibits no edema Lymphadenopathy: Has no other cervical adenopathy.  Neurological: Pt is alert and oriented to person, place, and time. Pt has normal reflexes. No cranial nerve deficit. Motor grossly intact, Gait intact Skin: Skin is warm and dry. No rash noted or new ulcerations Psychiatric:  Has normal mood and affect. Behavior is normal without agitation No other exam findings Lab Results  Component Value Date   WBC 8.0 03/19/2017   HGB 12.7 (L) 03/19/2017   HCT 40.6 03/19/2017   PLT 360.0 03/19/2017   GLUCOSE 90 03/19/2017   CHOL 140 03/19/2017   TRIG 119.0 03/19/2017   HDL 42.00 03/19/2017   LDLDIRECT 76.6 02/21/2013   LDLCALC 74 03/19/2017   ALT 21 03/19/2017   AST 16 03/19/2017   NA 138 03/19/2017   K 4.2 03/19/2017   CL 99 03/19/2017   CREATININE 1.07 03/19/2017   BUN 12 03/19/2017   CO2 30 03/19/2017   TSH 0.89 03/19/2017   PSA 1.77 03/19/2017   HGBA1C 6.3 03/19/2017       Assessment & Plan:

## 2017-03-19 NOTE — Patient Instructions (Signed)
Please take all new medication as prescribed - the low dose fluid pill for blood pressure  Please continue all other medications as before, and refills have been done if requested.  Please have the pharmacy call with any other refills you may need.  Please continue your efforts at being more active, low cholesterol diet, and weight control.  You are otherwise up to date with prevention measures today.  Please keep your appointments with your specialists as you may have planned  Please go to the LAB in the Basement (turn left off the elevator) for the tests to be done today  You will be contacted by phone if any changes need to be made immediately.  Otherwise, you will receive a letter about your results with an explanation, but please check with MyChart first.  Please remember to sign up for MyChart if you have not done so, as this will be important to you in the future with finding out test results, communicating by private email, and scheduling acute appointments online when needed.  Please return in 1 year for your yearly visit, or sooner if needed, with Lab testing done 3-5 days before

## 2017-03-21 NOTE — Assessment & Plan Note (Signed)
Mild uncontrolled, to add HCT 12.5 qd,  to f/u any worsening symptoms or concerns

## 2017-03-21 NOTE — Assessment & Plan Note (Signed)
Lab Results  Component Value Date   HGBA1C 6.3 03/19/2017  stable overall by history and exam, recent data reviewed with pt, and pt to continue medical treatment as before,  to f/u any worsening symptoms or concerns

## 2017-03-21 NOTE — Assessment & Plan Note (Signed)

## 2017-05-28 ENCOUNTER — Ambulatory Visit (HOSPITAL_COMMUNITY)
Admission: EM | Admit: 2017-05-28 | Discharge: 2017-05-28 | Disposition: A | Discharge status: Home / Self Care | Payer: BC Managed Care – PPO | Attending: Family Medicine | Admitting: Family Medicine

## 2017-05-28 ENCOUNTER — Encounter (HOSPITAL_COMMUNITY): Payer: Self-pay | Admitting: Emergency Medicine

## 2017-05-28 ENCOUNTER — Other Ambulatory Visit: Payer: Self-pay

## 2017-05-28 DIAGNOSIS — R197 Diarrhea, unspecified: Secondary | ICD-10-CM

## 2017-05-28 DIAGNOSIS — R112 Nausea with vomiting, unspecified: Secondary | ICD-10-CM

## 2017-05-28 MED ORDER — ONDANSETRON 4 MG PO TBDP: 4.0000 mg | ORAL_TABLET | Freq: Three times a day (TID) | ORAL | 0 refills | Status: AC | PRN

## 2017-05-28 NOTE — ED Triage Notes (Signed)
The patient presented to the [facility] with a complaint of a fever with N/V/D x3 days. The patient also complained of generalized body aches. The patient stated he was able to keep water down.

## 2017-05-28 NOTE — ED Provider Notes (Signed)
[facility]    CSN: [id number] Arrival date & time: 05/28/17  7106     History   Chief Complaint Chief Complaint  Patient presents with  . Diarrhea    HPI Bradley Donaldson is a 47 y.o. male history of Crohn's disease presenting today with nausea, vomiting and diarrhea.  States that he has had diarrhea for the past 3 days.  Approximately 3-4 times a day.  Denies hematochezia or melena.  Nausea and vomiting began yesterday.  Having difficulty tolerating solid oral intake.  Tolerating water.  Having mild abdominal soreness/cramping.  Denies urinary symptoms.  Denies URI symptoms of cough, congestion, sore throat.  Was having a fever yesterday, but no fevers today.  Children at home with similar symptoms earlier in the week.  Patient does not take any medicines for Crohn's, states that he normally has a flare 1-2 times a month, this is different given the nausea and vomiting.  Last colonoscopy over 15 years ago.  HPI  Past Medical History:  Diagnosis Date  . ALLERGIC RHINITIS 03/18/2007  . ANEMIA-IRON DEFICIENCY 03/18/2007  . CROHN'S DISEASE 01/04/2007  . DERMATITIS, SCALP 12/09/2007  . HYPERTENSION 01/04/2007  . Incisional hernia 06/18/2010  . URI 12/09/2007    Patient Active Problem List   Diagnosis Date Noted  . Hyperglycemia 03/19/2017  . Erectile dysfunction 02/27/2016  . Left flank pain 04/09/2015  . Skin nodule 04/09/2015  . Microhematuria 04/09/2015  . Itching 01/30/2014  . Rash and nonspecific skin eruption 01/30/2014  . OSA (obstructive sleep apnea) 08/28/2010  . Preventative health care 06/18/2010  . Incisional hernia 06/18/2010  . Goiter 06/18/2010  . Seborrheic dermatitis of scalp 12/09/2007  . ANEMIA-IRON DEFICIENCY 03/18/2007  . ALLERGIC RHINITIS 03/18/2007  . Essential hypertension 01/04/2007  . Regional enteritis [facility]) 01/04/2007    Past Surgical History:  Procedure Laterality Date  . partial colon removal  1998   Right hemicolectomy        Home Medications    Prior to Admission medications   Medication Sig Start Date End Date Taking? Authorizing Provider  amLODipine-olmesartan (AZOR) 5-40 MG tablet Take 1 tablet by mouth daily. 03/19/17   Biagio Borg, MD  aspirin EC 81 MG tablet Take 1 tablet (81 mg total) by mouth daily. 04/09/15   Biagio Borg, MD  fexofenadine (ALLEGRA) 180 MG tablet Take 1 tablet (180 mg total) by mouth daily. 04/09/15   Biagio Borg, MD  fluocinolone (SYNALAR) 0.01 % external solution Apply topically 2 (two) times daily. Applied 10 drops to the scalp after shampooing once daily 01/07/14   Darlyne Russian, MD  hydrochlorothiazide (HYDRODIURIL) 25 MG tablet Take 0.5 tablets (12.5 mg total) by mouth daily. 03/19/17   Biagio Borg, MD  ondansetron (ZOFRAN ODT) 4 MG disintegrating tablet Take 1 tablet (4 mg total) by mouth every 8 (eight) hours as needed for up to 4 days for nausea or vomiting. 05/28/17 06/01/17  Janith Lima, PA-C    Family History Family History  Problem Relation Age of Onset  . Hypertension Other   . Heart disease Father     Social History Social History   Tobacco Use  . Smoking status: Never Smoker  . Smokeless tobacco: Never Used  Substance Use Topics  . Alcohol use: Yes    Comment: occasional  . Drug use: No     Allergies   Ranitidine hcl   Review of Systems Review of Systems  Constitutional: Positive for fever. Negative for activity  change, appetite change and fatigue.  HENT: Negative for congestion, ear pain, postnasal drip, rhinorrhea, sinus pressure and sore throat.   Eyes: Negative for pain and itching.  Respiratory: Negative for cough and shortness of breath.   Cardiovascular: Negative for chest pain.  Gastrointestinal: Positive for abdominal pain, diarrhea, nausea and vomiting.  Genitourinary: Negative for dysuria.  Musculoskeletal: Negative for myalgias.  Skin: Negative for rash.  Neurological: Positive for weakness. Negative for dizziness,  light-headedness and headaches.     Physical Exam Triage Vital Signs ED Triage Vitals  Enc Vitals Group     BP 05/28/17 1021 132/83     Pulse Rate 05/28/17 1021 95     Resp 05/28/17 1021 16     Temp 05/28/17 1021 99 F (37.2 C)     Temp Source 05/28/17 1021 Oral     SpO2 05/28/17 1021 99 %     Weight --      Height --      Head Circumference --      Peak Flow --      Pain Score 05/28/17 1019 1     Pain Loc --      Pain Edu? --      Excl. in [place]? --    No data found.  Updated Vital Signs BP 132/83 (BP Location: Left Arm)   Pulse 95   Temp 99 F (37.2 C) (Oral)   Resp 16   SpO2 99%   Visual Acuity Right Eye Distance:   Left Eye Distance:   Bilateral Distance:    Right Eye Near:   Left Eye Near:    Bilateral Near:     Physical Exam  Constitutional: He appears well-developed and well-nourished.  HENT:  Head: Normocephalic and atraumatic.  Mouth/Throat: Oropharynx is clear and moist.  Oral mucosa moist  Eyes: Conjunctivae are normal.  Neck: Neck supple.  Cardiovascular: Normal rate and regular rhythm.  No murmur heard. Pulmonary/Chest: Effort normal and breath sounds normal. No respiratory distress.  Abdominal: Soft. There is no tenderness.  Abdomen is soft, nondistended.  Patient with large linear scar between right upper and lower quadrants.  Nontender to light and deep palpation.  Musculoskeletal: He exhibits no edema.  Neurological: He is alert.  Skin: Skin is warm and dry.  Psychiatric: He has a normal mood and affect.  Nursing note and vitals reviewed.    UC Treatments / Results  Labs (all labs ordered are listed, but only abnormal results are displayed) Labs Reviewed - No data to display  EKG  EKG Interpretation None       Radiology No results found.  Procedures Procedures (including critical care time)  Medications Ordered in UC Medications - No data to display   Initial Impression / Assessment and Plan / UC Course  I have  reviewed the triage vital signs and the nursing notes.  Pertinent labs & imaging results that were available during my care of the patient were reviewed by me and considered in my medical decision making (see chart for details).     Patient with likely viral gastroenteritis.  Abdominal exam negative.  Will treat symptomatically.  Zofran for nausea vomiting.  Discussed dietary recommendations.  Continue to monitor diarrhea.  Discussed importance of hydration.  Patient does not appear dehydrated at this time.  Stable. Discussed strict return precautions. Patient verbalized understanding and is agreeable with plan.   Final Clinical Impressions(s) / UC Diagnoses   Final diagnoses:  Nausea vomiting and diarrhea  ED Discharge Orders        Ordered    ondansetron (ZOFRAN ODT) 4 MG disintegrating tablet  Every 8 hours PRN     05/28/17 1039       Controlled Substance Prescriptions [place] Controlled Substance Registry consulted? Not Applicable   Janith Lima, [place] 05/28/17 1047

## 2017-07-11 IMAGING — CT CT RENAL STONE PROTOCOL
2 of 4 series · 16 of 46 positions shown, 18 images · non-contrast
Comparison: None.

CLINICAL DATA: Persistent left flank pain, microscopic hematuria.

EXAM:
CT ABDOMEN AND PELVIS WITHOUT CONTRAST
TECHNIQUE: Multidetector CT imaging of the abdomen and pelvis was performed
following the standard protocol without IV contrast.

[Series 2: stone study 5.0 i30f 1 · axial · 0.72mm/px · z∈[-462,-52]mm · 13 of 90 slices shown, 15 images]
[im 4/90  soft-tissue]
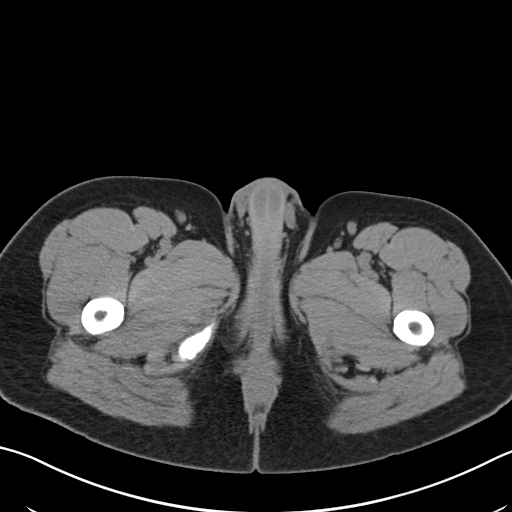
[im 4/90  bone]
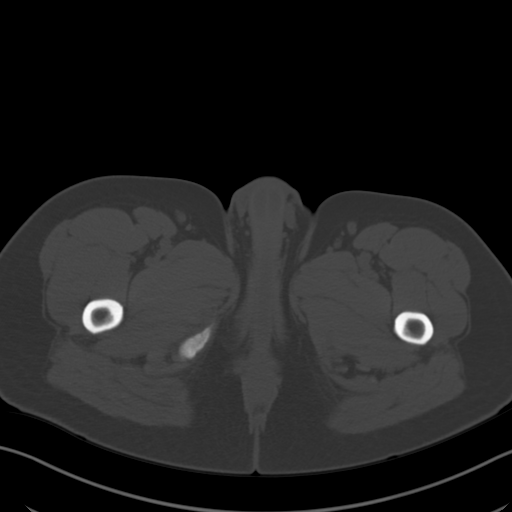
[im 11/90  soft-tissue]
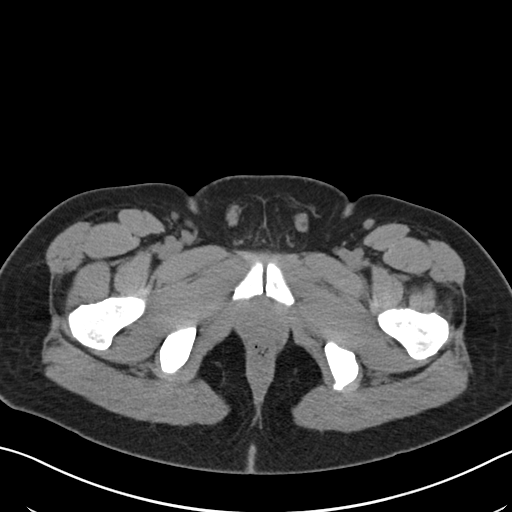
[im 18/90  soft-tissue]
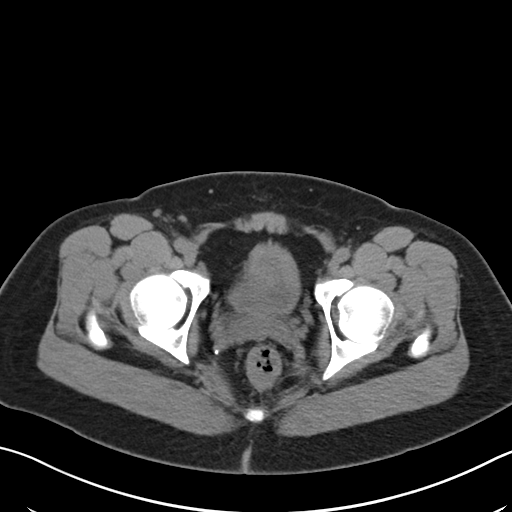
[im 25/90  soft-tissue]
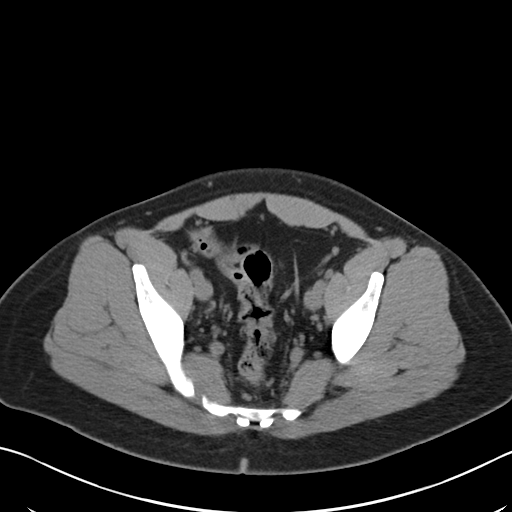
[im 33/90  soft-tissue]
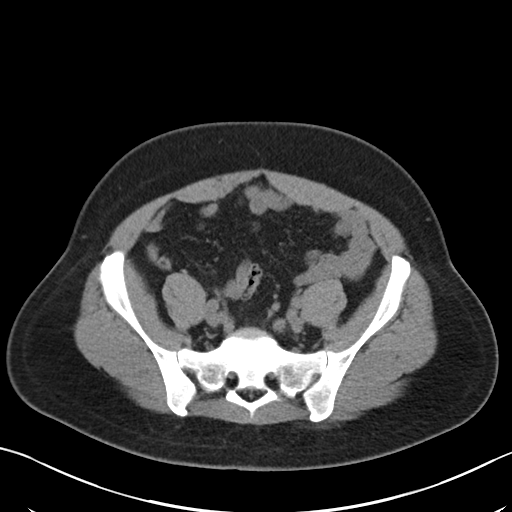
[im 40/90  soft-tissue]
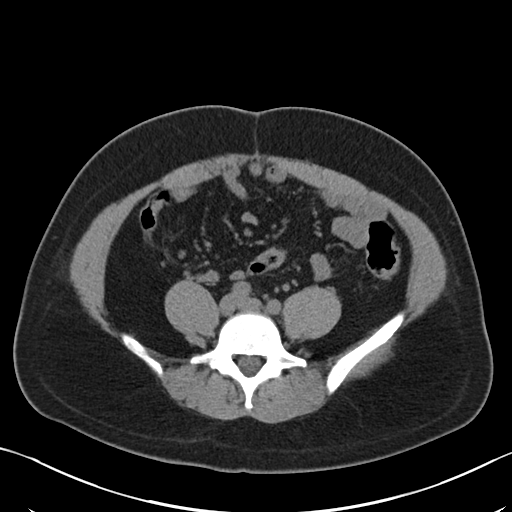
[im 47/90  soft-tissue]
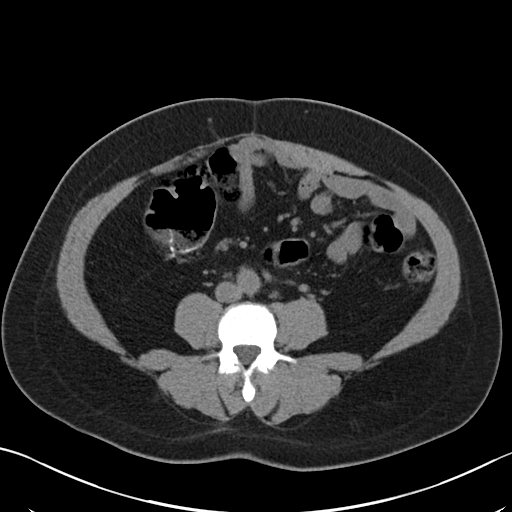
[im 50/90  soft-tissue]
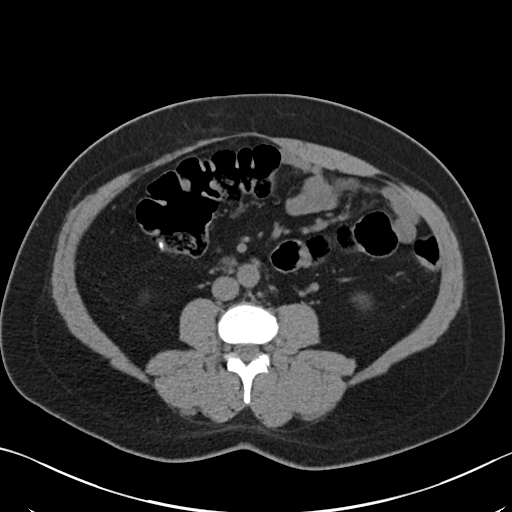
[im 57/90  soft-tissue]
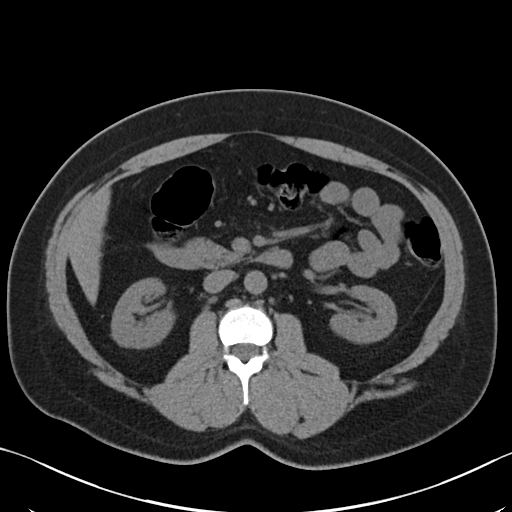
[im 57/90  bone]
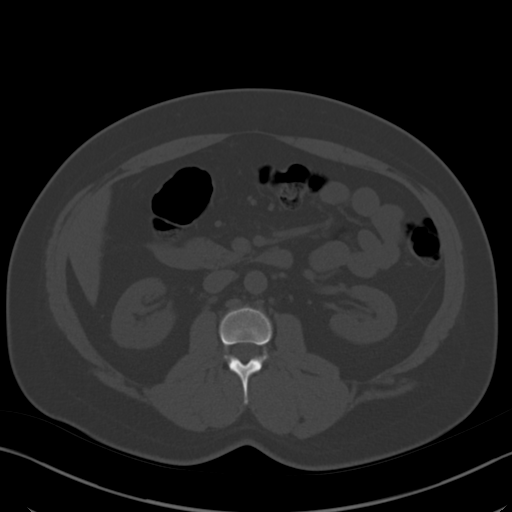
[im 65/90  soft-tissue]
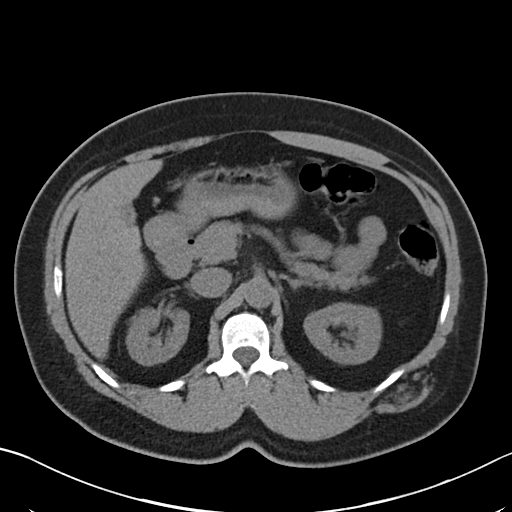
[im 72/90  soft-tissue]
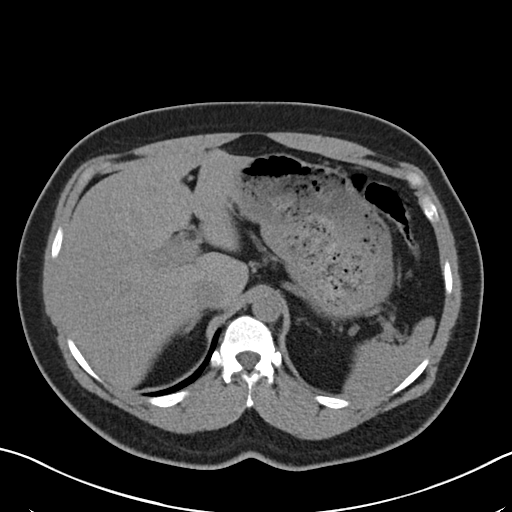
[im 79/90  soft-tissue]
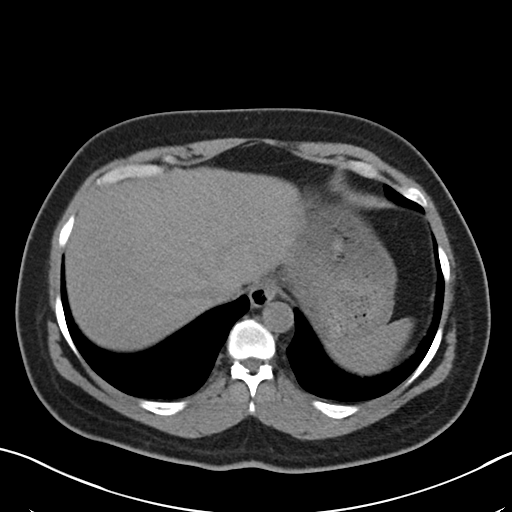
[im 86/90  soft-tissue]
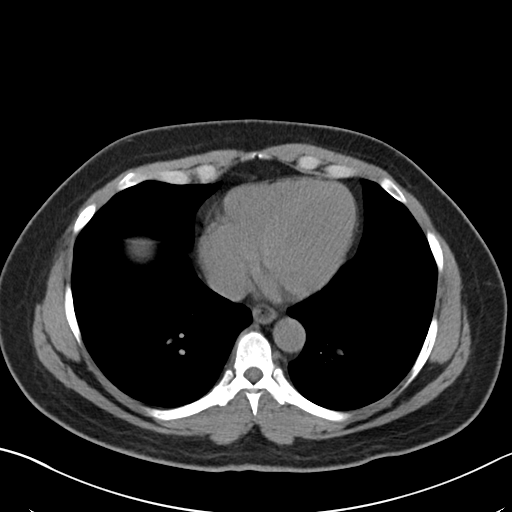

[Series 5: coronal soft tissue · coronal · 0.72mm/px · 3 of 86 slices shown]
[im 29/86  soft-tissue]
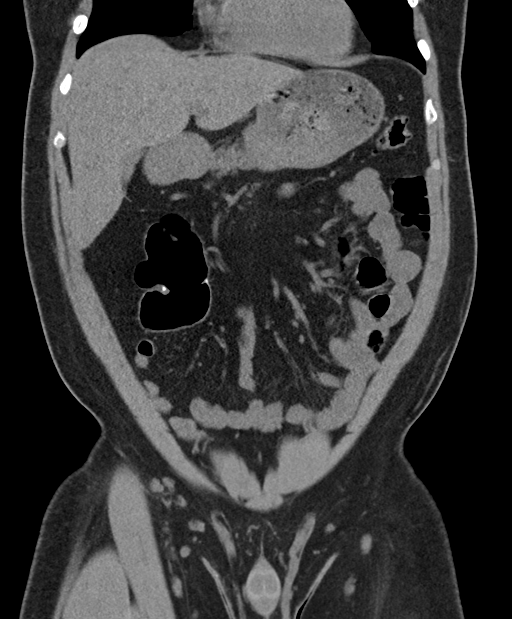
[im 38/86  soft-tissue]
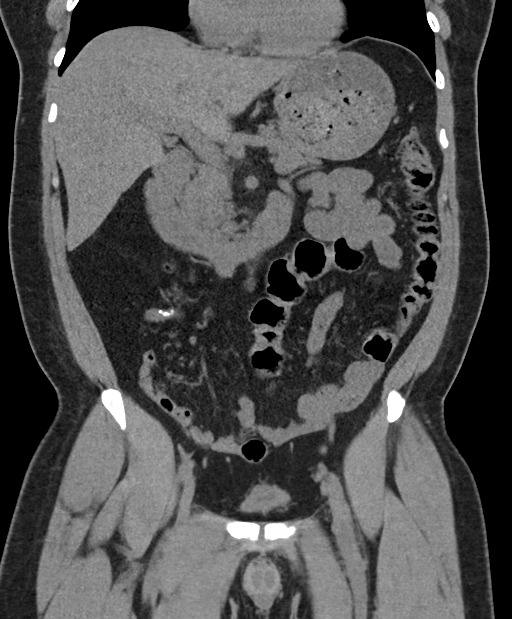
[im 48/86  soft-tissue]
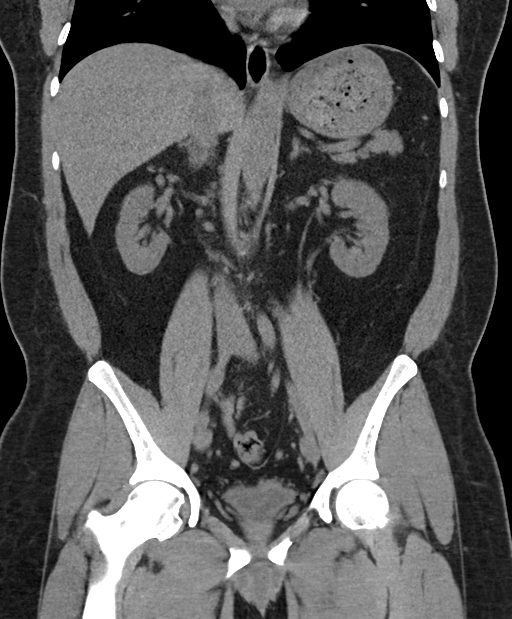

[16 of 46 positions shown; findings below may reference images not displayed]

FINDINGS: Lower chest:  No acute findings.

Hepatobiliary: No focal abnormality by unenhanced imaging. No
biliary dilatation. Gallbladder is completely collapsed.

Pancreas: No mass or inflammatory process identified on this
un-enhanced exam.

Spleen: Within normal limits in size.

Adrenals/Urinary Tract: Normal adrenal glands. Kidneys demonstrate
no acute obstruction, perinephric inflammatory process,
hydronephrosis, or obstructing ureteral calculus on either side.
Ureters appear duplicated bilaterally. tiny punctate 3 mm
nonobstructing renal calculi in the lower poles bilaterally.

Stomach/Bowel: Negative for obstruction, significant dilatation or
free air. Postop changes of the right colon from partial resection.
Appendix not demonstrated. Mildly prominent right lower quadrant
mesenteric lymph nodes, nonspecific.

Vascular/Lymphatic: No definite adenopathy. Mild tortuosity of the
aorta and iliac vessels. Iliac atherosclerosis evident. No aneurysm.

Reproductive: No mass or other significant abnormality.

Other: No inguinal abnormality or hernia.  Intact abdominal wall.

Musculoskeletal:  No acute osseous finding.
IMPRESSION: Punctate nonobstructing nephrolithiasis bilaterally. No acute
obstructive uropathy, hydronephrosis, or obstructing ureteral
calculus.

Duplicated collecting systems bilaterally.

No other acute intra-abdominal or pelvic process by noncontrast
imaging. Postop changes of the right colon.

## 2017-09-15 ENCOUNTER — Encounter: Payer: Self-pay | Admitting: Nurse Practitioner

## 2017-10-06 ENCOUNTER — Encounter: Payer: Self-pay | Admitting: Nurse Practitioner

## 2017-10-06 ENCOUNTER — Ambulatory Visit: Payer: BC Managed Care – PPO | Admitting: Nurse Practitioner

## 2017-10-06 VITALS — BP 120/80 | HR 82 | Ht 63.0 in | Wt 180.0 lb

## 2017-10-06 DIAGNOSIS — R194 Change in bowel habit: Secondary | ICD-10-CM

## 2017-10-06 DIAGNOSIS — Z8719 Personal history of other diseases of the digestive system: Secondary | ICD-10-CM | POA: Diagnosis not present

## 2017-10-06 NOTE — Patient Instructions (Signed)
If you are age 47 or older, your body mass index should be between 23-30. Your Body mass index is 31.89 kg/m. If this is out of the aforementioned range listed, please consider follow up with your Primary Care Provider.  If you are age 67 or younger, your body mass index should be between 19-25. Your Body mass index is 31.89 kg/m. If this is out of the aformentioned range listed, please consider follow up with your Primary Care Provider.   Please call back when the schedule is available to make appointment for colonoscopy with Dr. Fuller Plan or Dr. Lyndel Safe.  Thank you for choosing me and [facility].   Tye Savoy, NP

## 2017-10-06 NOTE — Progress Notes (Signed)
Chief Complaint: Intermittent diarrhea   Referring Provider:   [place];    46 yo male who gives a hx of Crohn's disease in late 1990's. Colonoscopy for rectal bleeding was remarkable for cecal mass which turned out to be Crohn's disease. Being the distant past, I doubt records available. No major problems for years, just some occasional mild episodes of bloating and loose stool. . Now having loose,  non-bloody stools about twice a week, , usually after eating. Remainder of time stools normal. He is concerned about bowel changes given history  -For further evaluation patient will be scheduled for a colonoscopy. He recalls Dr. Fuller Plan caring for him in the 1990's and would like to resume care with him.  The risks and benefits of colonoscopy with possible polypectomy were discussed and the patient agrees to proceed.   HPI:    Patient is a 47 yo male who had a colonoscopy with [place] in 1997 for rectal bleeding. 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 . He had a colon resection for what turned out to be Crohn's disease. He was treated with unknown medication for a couple of years then we apparently discontinued it. Patient says he was Dr. Lynne Leader patient but Dr. Henrene Pastor did the procedure. Off treatment he did well for many years just a 'flare out" every now and then. Now having postprandial loose stool a couple of times a week. In between episodes he has solid stools. He has frequent bloating. Eggs, oatmeal and mile are problematic.No abdominal pain, no N/V. No blood in stool. No weight loss.  No arthalgias   Past Medical History:  Diagnosis Date  . ALLERGIC RHINITIS 03/18/2007  . ANEMIA-IRON DEFICIENCY 03/18/2007  . CROHN'S DISEASE 01/04/2007  . DERMATITIS, SCALP 12/09/2007  . HYPERTENSION 01/04/2007  . Incisional hernia 06/18/2010  . URI 12/09/2007     Past Surgical History:  Procedure Laterality Date  . partial colon removal  1998   Right  hemicolectomy   Family History  Problem Relation Age of Onset  . Hypertension Other   . Heart disease Father    Social History   Tobacco Use  . Smoking status: Never Smoker  . Smokeless tobacco: Never Used  Substance Use Topics  . Alcohol use: Yes    Comment: occasional  . Drug use: No   Current Outpatient Medications  Medication Sig Dispense Refill  . amLODipine-olmesartan (AZOR) 5-40 MG tablet Take 1 tablet by mouth daily. 90 tablet 3  . aspirin EC 81 MG tablet Take 1 tablet (81 mg total) by mouth daily. 90 tablet 11  . hydrochlorothiazide (HYDRODIURIL) 25 MG tablet Take 0.5 tablets (12.5 mg total) by mouth daily. 45 tablet 3   No current facility-administered medications for this visit.    Allergies  Allergen Reactions  . Ranitidine Hcl     Review of Systems: All systems reviewed and negative except where noted in HPI.   Creatinine clearance cannot be calculated (Patient's most recent lab result is older than the maximum 21 days allowed.)   Physical Exam:    Wt Readings from Last 3 Encounters:  10/06/17 180 lb (81.6 kg)  03/19/17 185 lb (83.9 kg)  02/27/16 176 lb (79.8 kg)    BP 120/80   Pulse 82   Ht 5\' 3"  (1.6 m)   Wt 180 lb (81.6 kg)   SpO2 98%  BMI 31.89 kg/m  Constitutional:  Pleasant male in no acute distress. Psychiatric: Normal mood and affect. Behavior is normal. EENT: Pupils normal.  Conjunctivae are normal. No scleral icterus. Neck supple.  Cardiovascular: Normal rate, regular rhythm. No edema Pulmonary/chest: Effort normal and breath sounds normal. No wheezing, rales or rhonchi. Abdominal: Soft, nondistended, nontender. Bowel sounds active throughout. There are no masses palpable. No hepatomegaly. Neurological: Alert and oriented to person place and time. Skin: Skin is warm and dry. No rashes noted.  Tye Savoy, NP  10/06/2017, 8:46 AM  Cc: Biagio Borg, MD

## 2017-10-07 ENCOUNTER — Encounter: Payer: Self-pay | Admitting: Nurse Practitioner

## 2017-10-08 NOTE — Progress Notes (Signed)
Reviewed and agree with management plan.  Malcolm T. Stark, MD FACG 

## 2017-12-01 ENCOUNTER — Ambulatory Visit (AMBULATORY_SURGERY_CENTER): Payer: Self-pay

## 2017-12-01 ENCOUNTER — Encounter: Payer: Self-pay | Admitting: Gastroenterology

## 2017-12-01 ENCOUNTER — Other Ambulatory Visit: Payer: Self-pay

## 2017-12-01 VITALS — Ht 63.0 in | Wt 185.6 lb

## 2017-12-01 DIAGNOSIS — R194 Change in bowel habit: Secondary | ICD-10-CM

## 2017-12-01 MED ORDER — SOD PICOSULFATE-MAG OX-CIT ACD 10-3.5-12 MG-GM -GM/160ML PO SOLN: 1.0000 | Freq: Once | ORAL | 0 refills | Status: AC

## 2017-12-01 NOTE — Progress Notes (Signed)
No egg or soy allergy known to patient  No issues with past sedation with any surgeries  or procedures, no intubation problems  No diet pills per patient No home 02 use per patient  No blood thinners per patient  Pt denies issues with constipation  No A fib or A flutter  EMMI video sent to pt's e mail , pt declined    

## 2017-12-08 ENCOUNTER — Encounter: Payer: Self-pay | Admitting: Gastroenterology

## 2017-12-08 ENCOUNTER — Ambulatory Visit (AMBULATORY_SURGERY_CENTER): Payer: BC Managed Care – PPO | Admitting: Gastroenterology

## 2017-12-08 VITALS — BP 101/59 | HR 74 | Temp 98.6°F | Resp 12 | Ht 63.0 in | Wt 180.0 lb

## 2017-12-08 DIAGNOSIS — R194 Change in bowel habit: Secondary | ICD-10-CM | POA: Diagnosis not present

## 2017-12-08 MED ORDER — SODIUM CHLORIDE 0.9 % IV SOLN: 500.0000 mL | Freq: Once | INTRAVENOUS | Status: DC

## 2017-12-08 NOTE — Patient Instructions (Signed)
Impression/Recommendations:  Hemorrhoid handout given to patient.  Resume previous diet. Continue present medications.  Await pathology results.  Repeat colonoscopy for surveillance based on pathology results.  Return to GI clinic in 8 weeks, Dr. Fuller Plan.  YOU HAD AN ENDOSCOPIC PROCEDURE TODAY AT [facility]:   Refer to the procedure report that was given to you for any specific questions about what was found during the examination.  If the procedure report does not answer your questions, please call your gastroenterologist to clarify.  If you requested that your care partner not be given the details of your procedure findings, then the procedure report has been included in a sealed envelope for you to review at your convenience later.  YOU SHOULD EXPECT: Some feelings of bloating in the abdomen. Passage of more gas than usual.  Walking can help get rid of the air that was put into your GI tract during the procedure and reduce the bloating. If you had a lower endoscopy (such as a colonoscopy or flexible sigmoidoscopy) you may notice spotting of blood in your stool or on the toilet paper. If you underwent a bowel prep for your procedure, you may not have a normal bowel movement for a few days.  Please Note:  You might notice some irritation and congestion in your nose or some drainage.  This is from the oxygen used during your procedure.  There is no need for concern and it should clear up in a day or so.  SYMPTOMS TO REPORT IMMEDIATELY:   Following lower endoscopy (colonoscopy or flexible sigmoidoscopy):  Excessive amounts of blood in the stool  Significant tenderness or worsening of abdominal pains  Swelling of the abdomen that is new, acute  Fever of 100F or higher For urgent or emergent issues, a gastroenterologist can be reached at any hour by calling [phone].   DIET:  We do recommend a small meal at first, but then you may proceed to your regular diet.  Drink  plenty of fluids but you should avoid alcoholic beverages for 24 hours.  ACTIVITY:  You should plan to take it easy for the rest of today and you should NOT DRIVE or use heavy machinery until tomorrow (because of the sedation medicines used during the test).    FOLLOW UP: Our staff will call the number listed on your records the next business day following your procedure to check on you and address any questions or concerns that you may have regarding the information given to you following your procedure. If we do not reach you, we will leave a message.  However, if you are feeling well and you are not experiencing any problems, there is no need to return our call.  We will assume that you have returned to your regular daily activities without incident.  If any biopsies were taken you will be contacted by phone or by letter within the next 1-3 weeks.  Please call us at [phone] if you have not heard about the biopsies in 3 weeks.    SIGNATURES/CONFIDENTIALITY: You and/or your care partner have signed paperwork which will be entered into your electronic medical record.  These signatures attest to the fact that that the information above on your After Visit Summary has been reviewed and is understood.  Full responsibility of the confidentiality of this discharge information lies with you and/or your care-partner.

## 2017-12-08 NOTE — Progress Notes (Deleted)
Called to room to assist during endoscopic procedure.  Patient ID and intended procedure confirmed with present staff. Received instructions for my participation in the procedure from the performing physician.  

## 2017-12-08 NOTE — Progress Notes (Signed)
To PACU, VSS. Report to [email or web address] 

## 2017-12-08 NOTE — Progress Notes (Signed)
Pt's states no medical or surgical changes since previsit or office visit. 

## 2017-12-08 NOTE — Op Note (Addendum)
Bradley Donaldson Patient Name: Bradley Donaldson Procedure Date: 12/08/2017 11:02 AM MRN: [id number] Endoscopist: Jackquline Denmark , MD Age: 47 Referring MD:  Date of Birth: 26-Sep-1970 Gender: Male Account #: [id number] Procedure:                Colonoscopy Indications:              Crohn's disease status post right hemicolectomy                            10/1995 now with intermittent diarrhea. Medicines:                Monitored Anesthesia Care Procedure:                Pre-Anesthesia Assessment:                           - Prior to the procedure, a History and Physical                            was performed, and patient medications and                            allergies were reviewed. The patient's tolerance of                            previous anesthesia was also reviewed. The risks                            and benefits of the procedure and the sedation                            options and risks were discussed with the patient.                            All questions were answered, and informed consent                            was obtained. Prior Anticoagulants: The patient has                            taken no previous anticoagulant or antiplatelet                            agents. ASA Grade Assessment: II - A patient with                            mild systemic disease. After reviewing the risks                            and benefits, the patient was deemed in                            satisfactory condition to undergo the procedure.  After obtaining informed consent, the colonoscope                            was passed under direct vision. Throughout the                            procedure, the patient's blood pressure, pulse, and                            oxygen saturations were monitored continuously. The                            Colonoscope was introduced through the anus and                            advanced to the the  ileocolonic anastomosis. 4-5 cm                            of neoterminal ileum was intubated. The colonoscopy                            was performed without difficulty. The patient                            tolerated the procedure well. The quality of the                            bowel preparation was excellent. Scope In: 11:09:24 AM Scope Out: 11:20:17 AM Scope Withdrawal Time: 0 hours 8 minutes 56 seconds  Total Procedure Duration: 0 hours 10 minutes 53 seconds  Findings:                 A few (8-10) localized non-bleeding erosions were                            found at the end to side ileocolonic anastomosis.                            No stigmata of recent bleeding were seen. Biopsies                            were taken with a cold forceps for histology.                            Estimated blood loss: none. Neo-TI and remaining                            colon was normal. Biopsies for histology were taken                            with a cold forceps from the entire colon for                            evaluation  of microscopic colitis. Estimated blood                            loss: none.                           Non-bleeding internal hemorrhoids were found during                            retroflexion. The hemorrhoids were small. No                            peri-anal Crohn's. Complications:            No immediate complications. Estimated Blood Loss:     Estimated blood loss: none. Impression:               -Erosions at the anastomosis suggestive of                            postoperative recurrence. Mild endoscopic activity                            (biopsied)                           -Non-bleeding internal hemorrhoids. Recommendation:           - Resume previous diet.                           - Continue present medications.                           - Await pathology results.                           - Repeat colonoscopy for surveillance based on                             pathology results.                           - Return to GI clinic (Dr Fuller Plan) in 8 weeks. Jackquline Denmark, MD 12/08/2017 11:32:44 AM This report has been signed electronically.

## 2017-12-08 NOTE — Progress Notes (Signed)
Called to room to assist during endoscopic procedure.  Patient ID and intended procedure confirmed with present staff. Received instructions for my participation in the procedure from the performing physician.  

## 2017-12-09 ENCOUNTER — Telehealth: Payer: Self-pay

## 2017-12-09 NOTE — Telephone Encounter (Signed)
  Follow up Call-  Call back number 12/08/2017  Post procedure Call Back phone  # [phone]  Permission to leave phone message Yes  Some recent data might be hidden     Patient questions:  Do you have a fever, pain , or abdominal swelling? No. Pain Score  0 *  Have you tolerated food without any problems? Yes.    Have you been able to return to your normal activities? Yes.    Do you have any questions about your discharge instructions: Diet   No. Medications  No. Follow up visit  No.  Do you have questions or concerns about your Care? No.  Actions: * If pain score is 4 or above: No action needed, pain <4.

## 2017-12-15 ENCOUNTER — Encounter: Payer: Self-pay | Admitting: Gastroenterology

## 2018-02-01 ENCOUNTER — Ambulatory Visit: Payer: BC Managed Care – PPO | Admitting: Gastroenterology

## 2018-04-04 ENCOUNTER — Other Ambulatory Visit: Payer: Self-pay | Admitting: Internal Medicine

## 2018-04-22 ENCOUNTER — Other Ambulatory Visit: Payer: Self-pay | Admitting: Internal Medicine

## 2018-04-25 ENCOUNTER — Other Ambulatory Visit: Payer: Self-pay | Admitting: Internal Medicine

## 2018-04-26 MED ORDER — AMLODIPINE-OLMESARTAN 5-40 MG PO TABS: 1.0000 | ORAL_TABLET | Freq: Every day | ORAL | 0 refills | Status: DC

## 2018-04-26 NOTE — Telephone Encounter (Signed)
Pt called and scheduled medication refill appointment. Pt wants to know if enough mediation can be called in for him to get through until that time.

## 2018-04-26 NOTE — Telephone Encounter (Signed)
Per office policy sent 30 day to local pharmacy until appt.../lmb  

## 2018-04-26 NOTE — Telephone Encounter (Signed)
Spoke with Jone Baseman at [facility], she says that the last refill for amlodipine-olmesartan was 12/10/17.

## 2018-04-26 NOTE — Telephone Encounter (Signed)
Attempted to contact pt to verify pharmacy information; left message on voicemail [phone]; see original refill request dated 04/22/2018 which was refused due to pt needing appointment; pt now has appointment dated 2/ 26/2020 with Dr Cathlean Cower; will route to office for final disposition.   amLODipine-olmesartan (AZOR) 5-40 MG tablet [Pharmacy Med Name: amLODIPine-Olmesartan 5-40 MG Oral Tablet] 30 tablet 0 04/26/2018    Refill refused: Patient needs an appointment   Sig: TAKE 1 TABLET BY MOUTH ONCE DAILY    Original Order:  amLODipine-olmesartan (AZOR) 5-40 MG tablet [[id number]]    Pharmacy:  [place], [place] #:       Requested Prescriptions  Refused Prescriptions Disp Refills  . amLODipine-olmesartan (AZOR) 5-40 MG tablet [Pharmacy Med Name: amLODIPine-Olmesartan 5-40 MG Oral Tablet] 30 tablet 0    Sig: TAKE 1 TABLET BY MOUTH ONCE DAILY     Cardiovascular: CCB + ARB Combos Failed - 04/25/2018  7:13 PM      Failed - K in normal range and within 180 days    Potassium  Date Value Ref Range Status  03/19/2017 4.2 3.5 - 5.1 mEq/L Final         Failed - Cr in normal range and within 180 days    Creat  Date Value Ref Range Status  01/07/2014 1.13 0.50 - 1.35 mg/dL Final   Creatinine, Ser  Date Value Ref Range Status  03/19/2017 1.07 0.40 - 1.50 mg/dL Final         Failed - Valid encounter within last 6 months    Recent Outpatient Visits          1 year ago Preventative health care   [facility] -Georges Mouse, MD   2 years ago Preventative health care   [facility] -Georges Mouse, MD   3 years ago Preventative health care   [facility] -Georges Mouse, MD   3 years ago Lipoma of flank   [place], William F, MD   4 years ago Pruritus   [facility] [place], Dorian Heckle, [place]      Future  Appointments            In 1 week Biagio Borg, MD [place], [place] - Patient is not pregnant      Passed - Last BP in normal range    BP Readings from Last 1 Encounters:  12/08/17 (!) 101/59

## 2018-04-26 NOTE — Addendum Note (Signed)
Addended by: Addison Naegeli on: 04/26/2018 10:21 AM   Modules accepted: Orders

## 2018-04-26 NOTE — Addendum Note (Signed)
Addended by: Earnstine Regal on: 04/26/2018 11:49 AM   Modules accepted: Orders

## 2018-04-26 NOTE — Telephone Encounter (Signed)
[facility] to verify date of prescription refill; spoke with  Vicente Serene, Pharmacy Technician; she states that amlodipine-olmesartan was last filled 03/13/18 #30.

## 2018-04-29 ENCOUNTER — Telehealth: Payer: Self-pay

## 2018-04-29 ENCOUNTER — Other Ambulatory Visit (INDEPENDENT_AMBULATORY_CARE_PROVIDER_SITE_OTHER): Payer: BC Managed Care – PPO

## 2018-04-29 DIAGNOSIS — Z0000 Encounter for general adult medical examination without abnormal findings: Secondary | ICD-10-CM

## 2018-04-29 DIAGNOSIS — Z125 Encounter for screening for malignant neoplasm of prostate: Secondary | ICD-10-CM

## 2018-04-29 DIAGNOSIS — R739 Hyperglycemia, unspecified: Secondary | ICD-10-CM | POA: Diagnosis not present

## 2018-04-29 LAB — LIPID PANEL
Cholesterol: 154 mg/dL (ref 0–200)
HDL: 38.2 mg/dL — ABNORMAL LOW (ref >39.00)
LDL Cholesterol: 81 mg/dL (ref 0–99)
NonHDL: 115.56
TRIGLYCERIDES: 175 mg/dL — AB (ref 0.0–149.0)
Total CHOL/HDL Ratio: 4
VLDL: 35 mg/dL (ref 0.0–40.0)

## 2018-04-29 LAB — CBC WITH DIFFERENTIAL/PLATELET
Basophils Absolute: 0.1 10*3/uL (ref 0.0–0.1)
Basophils Relative: 1.2 % (ref 0.0–3.0)
Eosinophils Absolute: 0.1 10*3/uL (ref 0.0–0.7)
Eosinophils Relative: 1.6 % (ref 0.0–5.0)
HCT: 40.4 % (ref 39.0–52.0)
Hemoglobin: 12.8 g/dL — ABNORMAL LOW (ref 13.0–17.0)
Lymphocytes Relative: 32.1 % (ref 12.0–46.0)
Lymphs Abs: 2.7 10*3/uL (ref 0.7–4.0)
MCHC: 31.7 g/dL (ref 30.0–36.0)
MCV: 68.6 fl — ABNORMAL LOW (ref 78.0–100.0)
Monocytes Absolute: 0.7 10*3/uL (ref 0.1–1.0)
Monocytes Relative: 8.9 % (ref 3.0–12.0)
Neutro Abs: 4.7 10*3/uL (ref 1.4–7.7)
Neutrophils Relative %: 56.2 % (ref 43.0–77.0)
Platelets: 381 10*3/uL (ref 150.0–400.0)
RBC: 5.88 Mil/uL — ABNORMAL HIGH (ref 4.22–5.81)
RDW: 16.6 % — ABNORMAL HIGH (ref 11.5–15.5)
WBC: 8.3 10*3/uL (ref 4.0–10.5)

## 2018-04-29 LAB — URINALYSIS, ROUTINE W REFLEX MICROSCOPIC
Bilirubin Urine: NEGATIVE
Ketones, ur: NEGATIVE
Leukocytes,Ua: NEGATIVE
NITRITE: NEGATIVE
Specific Gravity, Urine: >=1.03 — AB (ref 1.000–1.030)
Total Protein, Urine: NEGATIVE
Urine Glucose: NEGATIVE
Urobilinogen, UA: 0.2 (ref 0.0–1.0)
WBC, UA: NONE SEEN (ref 0–?)
pH: 5.5 (ref 5.0–8.0)

## 2018-04-29 LAB — HEPATIC FUNCTION PANEL
ALT: 20 U/L (ref 0–53)
AST: 17 U/L (ref 0–37)
Albumin: 4.5 g/dL (ref 3.5–5.2)
Alkaline Phosphatase: 60 U/L (ref 39–117)
Bilirubin, Direct: 0.1 mg/dL (ref 0.0–0.3)
TOTAL PROTEIN: 7.8 g/dL (ref 6.0–8.3)
Total Bilirubin: 0.6 mg/dL (ref 0.2–1.2)

## 2018-04-29 LAB — BASIC METABOLIC PANEL
BUN: 14 mg/dL (ref 6–23)
CO2: 27 mEq/L (ref 19–32)
CREATININE: 1.32 mg/dL (ref 0.40–1.50)
Calcium: 9.6 mg/dL (ref 8.4–10.5)
Chloride: 100 mEq/L (ref 96–112)
GFR: 70.04 mL/min (ref >60.00)
Glucose, Bld: 91 mg/dL (ref 70–99)
Potassium: 4.4 mEq/L (ref 3.5–5.1)
Sodium: 139 mEq/L (ref 135–145)

## 2018-04-29 LAB — HEMOGLOBIN A1C: Hgb A1c MFr Bld: 6.3 % (ref 4.6–6.5)

## 2018-04-29 LAB — PSA: PSA: 1.85 ng/mL (ref 0.10–4.00)

## 2018-04-29 LAB — TSH: TSH: 0.78 u[IU]/mL (ref 0.35–4.50)

## 2018-04-29 NOTE — Telephone Encounter (Signed)
CPE labs needed to be entered

## 2018-05-04 ENCOUNTER — Encounter: Payer: Self-pay | Admitting: Internal Medicine

## 2018-05-04 ENCOUNTER — Ambulatory Visit: Payer: BC Managed Care – PPO | Admitting: Internal Medicine

## 2018-05-04 VITALS — BP 126/82 | HR 90 | Temp 98.2°F | Ht 63.0 in | Wt 185.0 lb

## 2018-05-04 DIAGNOSIS — R739 Hyperglycemia, unspecified: Secondary | ICD-10-CM

## 2018-05-04 DIAGNOSIS — Z23 Encounter for immunization: Secondary | ICD-10-CM

## 2018-05-04 DIAGNOSIS — Z0000 Encounter for general adult medical examination without abnormal findings: Secondary | ICD-10-CM

## 2018-05-04 NOTE — Progress Notes (Signed)
Subjective:    Patient ID: Bradley Donaldson, male    DOB: February 18, 1971, 48 y.o.   MRN: [id number]  HPI  Here for wellness and f/u;  Overall doing ok;  Pt denies Chest pain, worsening SOB, DOE, wheezing, orthopnea, PND, worsening LE edema, palpitations, dizziness or syncope.  Pt denies neurological change such as new headache, facial or extremity weakness.  Pt denies polydipsia, polyuria, or low sugar symptoms. Pt states overall good compliance with treatment and medications, good tolerability, and has been trying to follow appropriate diet.  Pt denies worsening depressive symptoms, suicidal ideation or panic. No fever, night sweats, wt loss, loss of appetite, or other constitutional symptoms.  Pt states good ability with ADL's, has low fall risk, home safety reviewed and adequate, no other significant changes in hearing or vision, and only occasionally active with exercise.  No new complaints Wt Readings from Last 3 Encounters:  05/04/18 185 lb (83.9 kg)  12/08/17 180 lb (81.6 kg)  12/01/17 185 lb 9.6 oz (84.2 kg)   Past Medical History:  Diagnosis Date  . ALLERGIC RHINITIS 03/18/2007  . ANEMIA-IRON DEFICIENCY 03/18/2007  . CROHN'S DISEASE 01/04/2007  . DERMATITIS, SCALP 12/09/2007  . HYPERTENSION 01/04/2007  . Incisional hernia 06/18/2010  . Sleep apnea    cpap  . URI 12/09/2007   Past Surgical History:  Procedure Laterality Date  . COLONOSCOPY    . partial colon removal  1998   Right hemicolectomy    reports that he has never smoked. He has never used smokeless tobacco. He reports current alcohol use. He reports that he does not use drugs. family history includes Heart disease in his father; Hypertension in an other family member. Allergies  Allergen Reactions  . Ranitidine Hcl    Current Outpatient Medications on File Prior to Visit  Medication Sig Dispense Refill  . amLODipine-olmesartan (AZOR) 5-40 MG tablet Take 1 tablet by mouth daily. Must keep scheduled appt for future refills 30  tablet 0  . aspirin EC 81 MG tablet Take 1 tablet (81 mg total) by mouth daily. 90 tablet 11  . hydrochlorothiazide (HYDRODIURIL) 25 MG tablet TAKE 0.5 TABLETS (12.5 MG TOTAL) BY MOUTH DAILY. 45 tablet 1   No current facility-administered medications on file prior to visit.    Review of Systems Constitutional: Negative for other unusual diaphoresis, sweats, appetite or weight changes HENT: Negative for other worsening hearing loss, ear pain, facial swelling, mouth sores or neck stiffness.   Eyes: Negative for other worsening pain, redness or other visual disturbance.  Respiratory: Negative for other stridor or swelling Cardiovascular: Negative for other palpitations or other chest pain  Gastrointestinal: Negative for worsening diarrhea or loose stools, blood in stool, distention or other pain Genitourinary: Negative for hematuria, flank pain or other change in urine volume.  Musculoskeletal: Negative for myalgias or other joint swelling.  Skin: Negative for other color change, or other wound or worsening drainage.  Neurological: Negative for other syncope or numbness. Hematological: Negative for other adenopathy or swelling Psychiatric/Behavioral: Negative for hallucinations, other worsening agitation, SI, self-injury, or new decreased concentration All other system neg per pt    Objective:   Physical Exam BP 126/82   Pulse 90   Temp 98.2 F (36.8 C) (Oral)   Ht 5\' 3"  (1.6 m)   Wt 185 lb (83.9 kg)   SpO2 97%   BMI 32.77 kg/m  VS noted,  Constitutional: Pt is oriented to person, place, and time. Appears well-developed and well-nourished, in no significant  distress and comfortable Head: Normocephalic and atraumatic  Eyes: Conjunctivae and EOM are normal. Pupils are equal, round, and reactive to light Right Ear: External ear normal without discharge Left Ear: External ear normal without discharge Nose: Nose without discharge or deformity Mouth/Throat: Oropharynx is without other  ulcerations and moist  Neck: Normal range of motion. Neck supple. No JVD present. No tracheal deviation present or significant neck LA or mass Cardiovascular: Normal rate, regular rhythm, normal heart sounds and intact distal pulses.   Pulmonary/Chest: WOB normal and breath sounds without rales or wheezing  Abdominal: Soft. Bowel sounds are normal. NT. No HSM  Musculoskeletal: Normal range of motion. Exhibits no edema Lymphadenopathy: Has no other cervical adenopathy.  Neurological: Pt is alert and oriented to person, place, and time. Pt has normal reflexes. No cranial nerve deficit. Motor grossly intact, Gait intact Skin: Skin is warm and dry. No rash noted or new ulcerations Psychiatric:  Has normal mood and affect. Behavior is normal without agitation No other exam findings Lab Results  Component Value Date   WBC 8.3 04/29/2018   HGB 12.8 (L) 04/29/2018   HCT 40.4 04/29/2018   PLT 381.0 04/29/2018   GLUCOSE 91 04/29/2018   CHOL 154 04/29/2018   TRIG 175.0 (H) 04/29/2018   HDL 38.20 (L) 04/29/2018   LDLDIRECT 76.6 02/21/2013   LDLCALC 81 04/29/2018   ALT 20 04/29/2018   AST 17 04/29/2018   NA 139 04/29/2018   K 4.4 04/29/2018   CL 100 04/29/2018   CREATININE 1.32 04/29/2018   BUN 14 04/29/2018   CO2 27 04/29/2018   TSH 0.78 04/29/2018   PSA 1.85 04/29/2018   HGBA1C 6.3 04/29/2018      Assessment & Plan:

## 2018-05-04 NOTE — Patient Instructions (Addendum)

## 2018-05-04 NOTE — Assessment & Plan Note (Signed)
stable overall by history and exam, recent data reviewed with pt, and pt to continue medical treatment as before,  to f/u any worsening symptoms or concerns  

## 2018-05-04 NOTE — Assessment & Plan Note (Signed)

## 2018-05-23 ENCOUNTER — Other Ambulatory Visit: Payer: Self-pay | Admitting: Internal Medicine

## 2018-05-23 MED ORDER — AMLODIPINE-OLMESARTAN 5-40 MG PO TABS: 1.0000 | ORAL_TABLET | Freq: Every day | ORAL | 0 refills | Status: DC

## 2018-05-23 NOTE — Telephone Encounter (Signed)
Copied from [place] [id number]. Topic: Quick Communication - Rx Refill/Question >> May 23, 2018  9:08 AM Reyne Dumas L wrote: Medication: amLODipine-olmesartan (AZOR) 5-40 MG tablet  Has the patient contacted their pharmacy? Yes - no new script received (Agent: If no, request that the patient contact the pharmacy for the refill.) (Agent: If yes, when and what did the pharmacy advise?)  Preferred Pharmacy (with phone number or street name): [facility] [address] [place] - [place] [phone] (Phone) [phone] (Fax)  Agent: Please be advised that RX refills may take up to 3 business days. We ask that you follow-up with your pharmacy.

## 2018-05-30 ENCOUNTER — Telehealth: Payer: Self-pay | Admitting: Internal Medicine

## 2018-05-30 MED ORDER — AMLODIPINE-OLMESARTAN 5-40 MG PO TABS: 1.0000 | ORAL_TABLET | Freq: Every day | ORAL | 3 refills | Status: DC

## 2018-05-30 NOTE — Telephone Encounter (Signed)
Pt has been informed that script was corrected and sent in to his pharmacy.

## 2018-05-30 NOTE — Telephone Encounter (Signed)
Copied from [place] [id number]. Topic: General - Other >> May 30, 2018  2:48 PM Lennox Solders wrote: Reason for CRM: pt left message on refill line and would like to know why he only got 30 day supply of amlodipine-olmesartan . Pt had a physical on 05-04-2018. [facility]. pt would like refill until 2021

## 2018-10-05 ENCOUNTER — Other Ambulatory Visit: Payer: Self-pay | Admitting: Internal Medicine

## 2018-12-24 ENCOUNTER — Ambulatory Visit (INDEPENDENT_AMBULATORY_CARE_PROVIDER_SITE_OTHER): Payer: BC Managed Care – PPO

## 2018-12-24 ENCOUNTER — Other Ambulatory Visit: Payer: Self-pay

## 2018-12-24 DIAGNOSIS — Z23 Encounter for immunization: Secondary | ICD-10-CM

## 2019-05-09 ENCOUNTER — Encounter: Payer: BC Managed Care – PPO | Admitting: Internal Medicine

## 2019-05-24 ENCOUNTER — Other Ambulatory Visit (INDEPENDENT_AMBULATORY_CARE_PROVIDER_SITE_OTHER): Payer: BC Managed Care – PPO

## 2019-05-24 DIAGNOSIS — Z0000 Encounter for general adult medical examination without abnormal findings: Secondary | ICD-10-CM

## 2019-05-24 DIAGNOSIS — R739 Hyperglycemia, unspecified: Secondary | ICD-10-CM | POA: Diagnosis not present

## 2019-05-24 LAB — TSH: TSH: 1.19 u[IU]/mL (ref 0.35–4.50)

## 2019-05-24 LAB — CBC WITH DIFFERENTIAL/PLATELET
Basophils Absolute: 0.1 10*3/uL (ref 0.0–0.1)
Basophils Relative: 0.9 % (ref 0.0–3.0)
Eosinophils Absolute: 0.1 10*3/uL (ref 0.0–0.7)
Eosinophils Relative: 0.8 % (ref 0.0–5.0)
HCT: 38.7 % — ABNORMAL LOW (ref 39.0–52.0)
Hemoglobin: 12.4 g/dL — ABNORMAL LOW (ref 13.0–17.0)
Lymphocytes Relative: 30.7 % (ref 12.0–46.0)
Lymphs Abs: 3.1 10*3/uL (ref 0.7–4.0)
MCHC: 32.1 g/dL (ref 30.0–36.0)
MCV: 68.8 fl — ABNORMAL LOW (ref 78.0–100.0)
Monocytes Absolute: 1.1 10*3/uL — ABNORMAL HIGH (ref 0.1–1.0)
Monocytes Relative: 10.9 % (ref 3.0–12.0)
Neutro Abs: 5.7 10*3/uL (ref 1.4–7.7)
Neutrophils Relative %: 56.7 % (ref 43.0–77.0)
Platelets: 344 10*3/uL (ref 150.0–400.0)
RBC: 5.61 Mil/uL (ref 4.22–5.81)
RDW: 16.5 % — ABNORMAL HIGH (ref 11.5–15.5)
WBC: 10.1 10*3/uL (ref 4.0–10.5)

## 2019-05-24 LAB — BASIC METABOLIC PANEL
BUN: 15 mg/dL (ref 6–23)
CO2: 32 mEq/L (ref 19–32)
Calcium: 9.5 mg/dL (ref 8.4–10.5)
Chloride: 101 mEq/L (ref 96–112)
Creatinine, Ser: 1.35 mg/dL (ref 0.40–1.50)
GFR: 67.94 mL/min (ref >60.00)
Glucose, Bld: 94 mg/dL (ref 70–99)
Potassium: 3.7 mEq/L (ref 3.5–5.1)
Sodium: 140 mEq/L (ref 135–145)

## 2019-05-24 LAB — LIPID PANEL
Cholesterol: 143 mg/dL (ref 0–200)
HDL: 36.8 mg/dL — ABNORMAL LOW (ref >39.00)
LDL Cholesterol: 82 mg/dL (ref 0–99)
NonHDL: 106.13
Total CHOL/HDL Ratio: 4
Triglycerides: 119 mg/dL (ref 0.0–149.0)
VLDL: 23.8 mg/dL (ref 0.0–40.0)

## 2019-05-24 LAB — URINALYSIS, ROUTINE W REFLEX MICROSCOPIC
Bilirubin Urine: NEGATIVE
Ketones, ur: NEGATIVE
Leukocytes,Ua: NEGATIVE
Nitrite: NEGATIVE
RBC / HPF: NONE SEEN (ref 0–?)
Specific Gravity, Urine: >=1.03 — AB (ref 1.000–1.030)
Total Protein, Urine: NEGATIVE
Urine Glucose: NEGATIVE
Urobilinogen, UA: 0.2 (ref 0.0–1.0)
WBC, UA: NONE SEEN (ref 0–?)
pH: 5.5 (ref 5.0–8.0)

## 2019-05-24 LAB — HEPATIC FUNCTION PANEL
ALT: 17 U/L (ref 0–53)
AST: 16 U/L (ref 0–37)
Albumin: 4.3 g/dL (ref 3.5–5.2)
Alkaline Phosphatase: 59 U/L (ref 39–117)
Bilirubin, Direct: 0.1 mg/dL (ref 0.0–0.3)
Total Bilirubin: 0.5 mg/dL (ref 0.2–1.2)
Total Protein: 7.8 g/dL (ref 6.0–8.3)

## 2019-05-24 LAB — HEMOGLOBIN A1C: Hgb A1c MFr Bld: 6.3 % (ref 4.6–6.5)

## 2019-05-24 LAB — PSA: PSA: 1.85 ng/mL (ref 0.10–4.00)

## 2019-06-05 ENCOUNTER — Other Ambulatory Visit: Payer: Self-pay

## 2019-06-05 ENCOUNTER — Ambulatory Visit (INDEPENDENT_AMBULATORY_CARE_PROVIDER_SITE_OTHER): Payer: BC Managed Care – PPO | Admitting: Internal Medicine

## 2019-06-05 ENCOUNTER — Encounter: Payer: Self-pay | Admitting: Internal Medicine

## 2019-06-05 ENCOUNTER — Ambulatory Visit: Payer: BC Managed Care – PPO | Attending: Internal Medicine

## 2019-06-05 VITALS — BP 138/88 | HR 79 | Temp 100.0°F | Ht 63.0 in | Wt 179.8 lb

## 2019-06-05 DIAGNOSIS — E559 Vitamin D deficiency, unspecified: Secondary | ICD-10-CM

## 2019-06-05 DIAGNOSIS — R509 Fever, unspecified: Secondary | ICD-10-CM | POA: Diagnosis not present

## 2019-06-05 DIAGNOSIS — Z0000 Encounter for general adult medical examination without abnormal findings: Secondary | ICD-10-CM

## 2019-06-05 DIAGNOSIS — R739 Hyperglycemia, unspecified: Secondary | ICD-10-CM

## 2019-06-05 DIAGNOSIS — E538 Deficiency of other specified B group vitamins: Secondary | ICD-10-CM | POA: Diagnosis not present

## 2019-06-05 DIAGNOSIS — Z20822 Contact with and (suspected) exposure to covid-19: Secondary | ICD-10-CM

## 2019-06-05 DIAGNOSIS — E611 Iron deficiency: Secondary | ICD-10-CM

## 2019-06-05 MED ORDER — HYDROCHLOROTHIAZIDE 25 MG PO TABS: 12.5000 mg | ORAL_TABLET | Freq: Every day | ORAL | 3 refills | Status: DC

## 2019-06-05 MED ORDER — AMLODIPINE-OLMESARTAN 5-40 MG PO TABS: 1.0000 | ORAL_TABLET | Freq: Every day | ORAL | 3 refills | Status: DC

## 2019-06-05 NOTE — Assessment & Plan Note (Signed)
Asympt, for covID testing today

## 2019-06-05 NOTE — Patient Instructions (Signed)
[place]: [place], [place]  Monday June 05, 2019  Arrive by 3:30 PM  Starts at 3:30 PM (15 minutes)  [place]  [phone] This is a visit at the [facility] [address], [place]. Someone will direct you where to park. Stay in your car and someone will be with you shortly.   Please continue all other medications as before, and refills have been done if requested.  Please have the pharmacy call with any other refills you may need.  Please continue your efforts at being more active, low cholesterol diet, and weight control.  You are otherwise up to date with prevention measures today.  Please keep your appointments with your specialists as you may have planned  Please make an Appointment to return for your 1 year visit, or sooner if needed, with Lab testing by Appointment as well, to be done about 3-5 days before at the [facility] (so this is for TWO appointments - please see the scheduling desk as you leave)

## 2019-06-05 NOTE — Progress Notes (Signed)
Subjective:    Patient ID: Bradley Donaldson, male    DOB: [birth year], 49 y.o.   MRN: [id number]  HPI  Here for wellness and f/u;  Overall doing ok;  Pt denies Chest pain, worsening SOB, DOE, wheezing, orthopnea, PND, worsening LE edema, palpitations, dizziness or syncope.  Pt denies neurological change such as new headache, facial or extremity weakness.  Pt denies polydipsia, polyuria, or low sugar symptoms. Pt states overall good compliance with treatment and medications, good tolerability, and has been trying to follow appropriate diet.  Pt denies worsening depressive symptoms, suicidal ideation or panic. No fever, night sweats, wt loss, loss of appetite, or other constitutional symptoms.  Pt states good ability with ADL's, has low fall risk, home safety reviewed and adequate, no other significant changes in hearing or vision, and only occasionally active with exercise.  No new complaints Past Medical History:  Diagnosis Date  . ALLERGIC RHINITIS 03/18/2007  . ANEMIA-IRON DEFICIENCY 03/18/2007  . CROHN'S DISEASE 01/04/2007  . DERMATITIS, SCALP 12/09/2007  . HYPERTENSION 01/04/2007  . Incisional hernia 06/18/2010  . Sleep apnea    cpap  . URI 12/09/2007   Past Surgical History:  Procedure Laterality Date  . COLONOSCOPY    . partial colon removal  1998   Right hemicolectomy    reports that he has never smoked. He has never used smokeless tobacco. He reports current alcohol use. He reports that he does not use drugs. family history includes Heart disease in his father; Hypertension in an other family member. Allergies  Allergen Reactions  . Ranitidine Hcl    Current Outpatient Medications on File Prior to Visit  Medication Sig Dispense Refill  . amLODipine-olmesartan (AZOR) 5-40 MG tablet Take 1 tablet by mouth daily. 90 tablet 3  . aspirin EC 81 MG tablet Take 1 tablet (81 mg total) by mouth daily. 90 tablet 11  . hydrochlorothiazide (HYDRODIURIL) 25 MG tablet TAKE 1/2 TABLET BY MOUTH EVERY  DAY 45 tablet 2   No current facility-administered medications on file prior to visit.   Review of Systems All otherwise neg per pt     Objective:   Physical Exam BP 138/88   Pulse 79   Temp 100 F (37.8 C)   Ht 5\' 3"  (1.6 m)   Wt 179 lb 12.8 oz (81.6 kg)   SpO2 98%   BMI 31.85 kg/m  VS noted,  Constitutional: Pt appears in NAD HENT: Head: NCAT.  Right Ear: External ear normal.  Left Ear: External ear normal.  Eyes: . Pupils are equal, round, and reactive to light. Conjunctivae and EOM are normal Nose: without d/c or deformity Neck: Neck supple. Gross normal ROM Cardiovascular: Normal rate and regular rhythm.   Pulmonary/Chest: Effort normal and breath sounds without rales or wheezing.  Abd:  Soft, NT, ND, + BS, no organomegaly Neurological: Pt is alert. At baseline orientation, motor grossly intact Skin: Skin is warm. No rashes, other new lesions, no LE edema Psychiatric: Pt behavior is normal without agitation  All otherwise neg per pt Lab Results  Component Value Date   WBC 10.1 05/24/2019   HGB 12.4 (L) 05/24/2019   HCT 38.7 (L) 05/24/2019   PLT 344.0 05/24/2019   GLUCOSE 94 05/24/2019   CHOL 143 05/24/2019   TRIG 119.0 05/24/2019   HDL 36.80 (L) 05/24/2019   LDLDIRECT 76.6 02/21/2013   LDLCALC 82 05/24/2019   ALT 17 05/24/2019   AST 16 05/24/2019   NA 140 05/24/2019   K 3.7  05/24/2019   CL 101 05/24/2019   CREATININE 1.35 05/24/2019   BUN 15 05/24/2019   CO2 32 05/24/2019   TSH 1.19 05/24/2019   PSA 1.85 05/24/2019   HGBA1C 6.3 05/24/2019      Assessment & Plan:

## 2019-06-05 NOTE — Addendum Note (Signed)
Addended by: Biagio Borg on: 06/05/2019 08:08 PM   Modules accepted: Orders

## 2019-06-05 NOTE — Assessment & Plan Note (Signed)
stable overall by history and exam, recent data reviewed with pt, and pt to continue medical treatment as before,  to f/u any worsening symptoms or concerns  

## 2019-06-05 NOTE — Assessment & Plan Note (Signed)

## 2019-06-06 LAB — NOVEL CORONAVIRUS, NAA: SARS-CoV-2, NAA: NOT DETECTED

## 2019-06-06 LAB — SARS-COV-2, NAA 2 DAY TAT

## 2019-07-20 ENCOUNTER — Other Ambulatory Visit: Payer: Self-pay | Admitting: Internal Medicine

## 2019-07-20 ENCOUNTER — Encounter: Payer: Self-pay | Admitting: Internal Medicine

## 2019-07-20 MED ORDER — TADALAFIL 20 MG PO TABS: 10.0000 mg | ORAL_TABLET | ORAL | 11 refills | Status: DC | PRN

## 2019-07-20 NOTE — Telephone Encounter (Signed)
Done erx 

## 2020-06-07 ENCOUNTER — Other Ambulatory Visit (INDEPENDENT_AMBULATORY_CARE_PROVIDER_SITE_OTHER): Payer: Self-pay

## 2020-06-07 DIAGNOSIS — E559 Vitamin D deficiency, unspecified: Secondary | ICD-10-CM

## 2020-06-07 DIAGNOSIS — E611 Iron deficiency: Secondary | ICD-10-CM

## 2020-06-07 DIAGNOSIS — E538 Deficiency of other specified B group vitamins: Secondary | ICD-10-CM

## 2020-06-07 DIAGNOSIS — R739 Hyperglycemia, unspecified: Secondary | ICD-10-CM

## 2020-06-07 DIAGNOSIS — Z0000 Encounter for general adult medical examination without abnormal findings: Secondary | ICD-10-CM

## 2020-06-07 LAB — CBC WITH DIFFERENTIAL/PLATELET
Basophils Absolute: 0.1 10*3/uL (ref 0.0–0.1)
Basophils Relative: 1.2 % (ref 0.0–3.0)
Eosinophils Absolute: 0.1 10*3/uL (ref 0.0–0.7)
Eosinophils Relative: 0.9 % (ref 0.0–5.0)
HCT: 41.1 % (ref 39.0–52.0)
Hemoglobin: 13.1 g/dL (ref 13.0–17.0)
Lymphocytes Relative: 26.3 % (ref 12.0–46.0)
Lymphs Abs: 2.5 10*3/uL (ref 0.7–4.0)
MCHC: 32 g/dL (ref 30.0–36.0)
MCV: 67.3 fl — ABNORMAL LOW (ref 78.0–100.0)
Monocytes Absolute: 0.7 10*3/uL (ref 0.1–1.0)
Monocytes Relative: 7.5 % (ref 3.0–12.0)
Neutro Abs: 6.2 10*3/uL (ref 1.4–7.7)
Neutrophils Relative %: 64.1 % (ref 43.0–77.0)
Platelets: 340 10*3/uL (ref 150.0–400.0)
RBC: 6.11 Mil/uL — ABNORMAL HIGH (ref 4.22–5.81)
RDW: 16.6 % — ABNORMAL HIGH (ref 11.5–15.5)
WBC: 9.6 10*3/uL (ref 4.0–10.5)

## 2020-06-07 LAB — HEPATIC FUNCTION PANEL
ALT: 17 U/L (ref 0–53)
AST: 16 U/L (ref 0–37)
Albumin: 4.6 g/dL (ref 3.5–5.2)
Alkaline Phosphatase: 61 U/L (ref 39–117)
Bilirubin, Direct: 0.1 mg/dL (ref 0.0–0.3)
Total Bilirubin: 0.6 mg/dL (ref 0.2–1.2)
Total Protein: 8.2 g/dL (ref 6.0–8.3)

## 2020-06-07 LAB — URINALYSIS, ROUTINE W REFLEX MICROSCOPIC
Bilirubin Urine: NEGATIVE
Ketones, ur: NEGATIVE
Leukocytes,Ua: NEGATIVE
Nitrite: NEGATIVE
Specific Gravity, Urine: 1.02 (ref 1.000–1.030)
Total Protein, Urine: NEGATIVE
Urine Glucose: NEGATIVE
Urobilinogen, UA: 0.2 (ref 0.0–1.0)
WBC, UA: NONE SEEN (ref 0–?)
pH: 5.5 (ref 5.0–8.0)

## 2020-06-07 LAB — BASIC METABOLIC PANEL
BUN: 15 mg/dL (ref 6–23)
CO2: 27 mEq/L (ref 19–32)
Calcium: 10 mg/dL (ref 8.4–10.5)
Chloride: 99 mEq/L (ref 96–112)
Creatinine, Ser: 1.35 mg/dL (ref 0.40–1.50)
GFR: 61.38 mL/min (ref >60.00)
Glucose, Bld: 86 mg/dL (ref 70–99)
Potassium: 4.3 mEq/L (ref 3.5–5.1)
Sodium: 138 mEq/L (ref 135–145)

## 2020-06-07 LAB — LIPID PANEL
Cholesterol: 149 mg/dL (ref 0–200)
HDL: 43 mg/dL (ref >39.00)
LDL Cholesterol: 85 mg/dL (ref 0–99)
NonHDL: 106.14
Total CHOL/HDL Ratio: 3
Triglycerides: 108 mg/dL (ref 0.0–149.0)
VLDL: 21.6 mg/dL (ref 0.0–40.0)

## 2020-06-07 LAB — IBC PANEL
Iron: 71 ug/dL (ref 42–165)
Saturation Ratios: 19.3 % — ABNORMAL LOW (ref 20.0–50.0)
Transferrin: 263 mg/dL (ref 212.0–360.0)

## 2020-06-07 LAB — PSA: PSA: 1.79 ng/mL (ref 0.10–4.00)

## 2020-06-07 LAB — VITAMIN B12: Vitamin B-12: 139 pg/mL — ABNORMAL LOW (ref 211–911)

## 2020-06-07 LAB — VITAMIN D 25 HYDROXY (VIT D DEFICIENCY, FRACTURES): VITD: <7 ng/mL — ABNORMAL LOW (ref 30.00–100.00)

## 2020-06-07 LAB — HEMOGLOBIN A1C: Hgb A1c MFr Bld: 6.4 % (ref 4.6–6.5)

## 2020-06-07 LAB — TSH: TSH: 1.2 u[IU]/mL (ref 0.35–4.50)

## 2020-06-14 ENCOUNTER — Other Ambulatory Visit: Payer: Self-pay

## 2020-06-14 ENCOUNTER — Encounter: Payer: Self-pay | Admitting: Internal Medicine

## 2020-06-14 ENCOUNTER — Ambulatory Visit (INDEPENDENT_AMBULATORY_CARE_PROVIDER_SITE_OTHER): Payer: No Typology Code available for payment source | Admitting: Internal Medicine

## 2020-06-14 VITALS — BP 146/80 | HR 99 | Ht 63.0 in | Wt 183.0 lb

## 2020-06-14 DIAGNOSIS — R739 Hyperglycemia, unspecified: Secondary | ICD-10-CM

## 2020-06-14 DIAGNOSIS — R718 Other abnormality of red blood cells: Secondary | ICD-10-CM

## 2020-06-14 DIAGNOSIS — E559 Vitamin D deficiency, unspecified: Secondary | ICD-10-CM | POA: Diagnosis not present

## 2020-06-14 DIAGNOSIS — F419 Anxiety disorder, unspecified: Secondary | ICD-10-CM | POA: Diagnosis not present

## 2020-06-14 DIAGNOSIS — I10 Essential (primary) hypertension: Secondary | ICD-10-CM | POA: Diagnosis not present

## 2020-06-14 DIAGNOSIS — Z0001 Encounter for general adult medical examination with abnormal findings: Secondary | ICD-10-CM | POA: Diagnosis not present

## 2020-06-14 DIAGNOSIS — E538 Deficiency of other specified B group vitamins: Secondary | ICD-10-CM | POA: Diagnosis not present

## 2020-06-14 MED ORDER — AMLODIPINE-OLMESARTAN 5-40 MG PO TABS: 1.0000 | ORAL_TABLET | Freq: Every day | ORAL | 3 refills | Status: DC

## 2020-06-14 MED ORDER — HYDROCHLOROTHIAZIDE 25 MG PO TABS: 12.5000 mg | ORAL_TABLET | Freq: Every day | ORAL | 3 refills | Status: DC

## 2020-06-14 MED ORDER — TADALAFIL 20 MG PO TABS
10.0000 mg | ORAL_TABLET | ORAL | 11 refills | Status: DC | PRN
Start: 2020-06-14 — End: 2021-04-11

## 2020-06-14 NOTE — Patient Instructions (Signed)
Please continue to monitor your blood pressure at home, with the goal being to be at least less than 140/90, and less than 130/80 is even better  Please consider taking the counseling offered at work  Please take OTC Vitamin D3 at 2000 units per day, indefinitely.  Please also take OTC B12 10000 mcg per day  Please continue all other medications as before, and refills have been done if requested.  Please have the pharmacy call with any other refills you may need.  Please continue your efforts at being more active, low cholesterol diet, and weight control.  You are otherwise up to date with prevention measures today.  Please keep your appointments with your specialists as you may have planned  Please make an Appointment to return in 6 months, or sooner if needed, also with Lab Appointment for testing done 3-5 days before at the [facility] (so this is for TWO appointments - please see the scheduling desk as you leave)  Due to the ongoing Covid 19 pandemic, our lab now requires an appointment for any labs done at our office.  If you need labs done and do not have an appointment, please call our office ahead of time to schedule before presenting to the lab for your testing.

## 2020-06-14 NOTE — Progress Notes (Signed)
Patient ID: Bradley Donaldson, male   DOB: Nov 22, 1970, 50 y.o.   MRN: [id number]         Chief Complaint:: wellness exam and f/u htn, anxiety, low vit d and b12, olow MCV       HPI:  Bradley Donaldson is a 50 y.o. male here for wellness exam; declines hep c screen.  O/w up to date with preventive referrals and immunizations.                        Also BP at home has been < 140/90.  Pt denies chest pain, increased sob or doe, wheezing, orthopnea, PND, increased LE swelling, palpitations, dizziness or syncope.   Pt denies polydipsia, polyuria,   Pt states overall good compliance with meds, trying to follow lower cholesterol, diabetic diet, wt overall stable but little exercise however.   Plans to get more exercise with better weather now. Denies worsening depressive symptoms, suicidal ideation, or panic; has ongoing anxiety, some increased recently with multiple social stressors, and plans to take advantage of counseling offered through his work on his own.  Denies new focal neuro s/s.   Pt denies fever, wt loss, night sweats, loss of appetite, or other constitutional symptoms  Not taking Vit D or b12.     Wt Readings from Last 3 Encounters:  06/14/20 183 lb (83 kg)  06/05/19 179 lb 12.8 oz (81.6 kg)  05/04/18 185 lb (83.9 kg)   BP Readings from Last 3 Encounters:  06/14/20 (!) 146/80  06/05/19 138/88  05/04/18 126/82   Immunization History  Administered Date(s) Administered  . Influenza Split 01/18/2012  . Influenza Whole 12/09/2007  . Influenza,inj,Quad PF,6+ Mos 02/27/2016, 05/04/2018, 12/24/2018  . PFIZER(Purple Top)SARS-COV-2 Vaccination 04/22/2019  . Tdap 02/27/2016   There are no preventive care reminders to display for this patient.    Past Medical History:  Diagnosis Date  . ALLERGIC RHINITIS 03/18/2007  . ANEMIA-IRON DEFICIENCY 03/18/2007  . CROHN'S DISEASE 01/04/2007  . DERMATITIS, SCALP 12/09/2007  . HYPERTENSION 01/04/2007  . Incisional hernia 06/18/2010  . Sleep apnea    cpap  .  URI 12/09/2007   Past Surgical History:  Procedure Laterality Date  . COLONOSCOPY    . partial colon removal  1998   Right hemicolectomy    reports that he has never smoked. He has never used smokeless tobacco. He reports current alcohol use. He reports that he does not use drugs. family history includes Heart disease in his father; Hypertension in an other family member. Allergies  Allergen Reactions  . Ranitidine Hcl    Current Outpatient Medications on File Prior to Visit  Medication Sig Dispense Refill  . aspirin EC 81 MG tablet Take 1 tablet (81 mg total) by mouth daily. 90 tablet 11   No current facility-administered medications on file prior to visit.        ROS:  All others reviewed and negative.  Objective        PE:  BP (!) 146/80 (BP Location: Right Arm, Patient Position: Sitting, Cuff Size: Large)   Pulse 99   Ht 5\' 3"  (1.6 m)   Wt 183 lb (83 kg)   SpO2 97%   BMI 32.42 kg/m                 Constitutional: Pt appears in NAD               HENT: Head: NCAT.  Right Ear: External ear normal.                 Left Ear: External ear normal.                Eyes: . Pupils are equal, round, and reactive to light. Conjunctivae and EOM are normal               Nose: without d/c or deformity               Neck: Neck supple. Gross normal ROM               Cardiovascular: Normal rate and regular rhythm.                 Pulmonary/Chest: Effort normal and breath sounds without rales or wheezing.                Abd:  Soft, NT, ND, + BS, no organomegaly               Neurological: Pt is alert. At baseline orientation, motor grossly intact               Skin: Skin is warm. No rashes, no other new lesions, LE edema -none               Psychiatric: Pt behavior is normal without agitation   Micro: none  Cardiac tracings I have personally interpreted today:  none  Pertinent Radiological findings (summarize): none   Lab Results  Component Value Date   WBC 9.6  06/07/2020   HGB 13.1 06/07/2020   HCT 41.1 06/07/2020   PLT 340.0 06/07/2020   GLUCOSE 86 06/07/2020   CHOL 149 06/07/2020   TRIG 108.0 06/07/2020   HDL 43.00 06/07/2020   LDLDIRECT 76.6 02/21/2013   LDLCALC 85 06/07/2020   ALT 17 06/07/2020   AST 16 06/07/2020   NA 138 06/07/2020   K 4.3 06/07/2020   CL 99 06/07/2020   CREATININE 1.35 06/07/2020   BUN 15 06/07/2020   CO2 27 06/07/2020   TSH 1.20 06/07/2020   PSA 1.79 06/07/2020   HGBA1C 6.4 06/07/2020   Assessment/Plan:  Bradley Donaldson is a 50 y.o. Black or African American [2] male with  has a past medical history of ALLERGIC RHINITIS (03/18/2007), ANEMIA-IRON DEFICIENCY (03/18/2007), CROHN'S DISEASE (01/04/2007), DERMATITIS, SCALP (12/09/2007), HYPERTENSION (01/04/2007), Incisional hernia (06/18/2010), Sleep apnea, and URI (12/09/2007).  Encounter for well adult exam with abnormal findings Age and sex appropriate education and counseling updated with regular exercise and diet Referrals for preventative services - none needed Immunizations addressed - none needed Smoking counseling  - none needed Evidence for depression or other mood disorder - none significant Most recent labs reviewed. I have personally reviewed and have noted: 1) the patient's medical and social history 2) The patient's current medications and supplements 3) The patient's height, weight, and BMI have been recorded in the chart   Hyperglycemia Lab Results  Component Value Date   HGBA1C 6.4 06/07/2020   Stable, pt to continue current medical treatment  -- diet   Essential hypertension BP Readings from Last 3 Encounters:  06/14/20 (!) 146/80  06/05/19 138/88  05/04/18 126/82   Stable, pt to continue medical treatment as state BP at home < 140/90; cont azor and hct   Current Outpatient Medications (Cardiovascular):  .  amLODipine-olmesartan (AZOR) 5-40 MG tablet, Take 1 tablet by mouth daily. .  hydrochlorothiazide (HYDRODIURIL) 25 MG tablet, Take  0.5  tablets (12.5 mg total) by mouth daily. .  tadalafil (CIALIS) 20 MG tablet, Take 0.5-1 tablets (10-20 mg total) by mouth every other day as needed for erectile dysfunction.   Current Outpatient Medications (Analgesics):  .  aspirin EC 81 MG tablet, Take 1 tablet (81 mg total) by mouth daily.     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  Cathlean Cower, MD 06/15/2020 4:05 AM [facility]

## 2020-06-15 ENCOUNTER — Encounter: Payer: Self-pay | Admitting: Internal Medicine

## 2020-06-15 DIAGNOSIS — F419 Anxiety disorder, unspecified: Secondary | ICD-10-CM | POA: Insufficient documentation

## 2020-06-15 DIAGNOSIS — E559 Vitamin D deficiency, unspecified: Secondary | ICD-10-CM | POA: Insufficient documentation

## 2020-06-15 DIAGNOSIS — R718 Other abnormality of red blood cells: Secondary | ICD-10-CM | POA: Insufficient documentation

## 2020-06-15 DIAGNOSIS — E538 Deficiency of other specified B group vitamins: Secondary | ICD-10-CM | POA: Insufficient documentation

## 2020-06-15 NOTE — Assessment & Plan Note (Signed)
BP Readings from Last 3 Encounters:  06/14/20 (!) 146/80  06/05/19 138/88  05/04/18 126/82   Stable, pt to continue medical treatment as state BP at home < 140/90; cont azor and hct   Current Outpatient Medications (Cardiovascular):  .  amLODipine-olmesartan (AZOR) 5-40 MG tablet, Take 1 tablet by mouth daily. .  hydrochlorothiazide (HYDRODIURIL) 25 MG tablet, Take 0.5 tablets (12.5 mg total) by mouth daily. .  tadalafil (CIALIS) 20 MG tablet, Take 0.5-1 tablets (10-20 mg total) by mouth every other day as needed for erectile dysfunction.   Current Outpatient Medications (Analgesics):  .  aspirin EC 81 MG tablet, Take 1 tablet (81 mg total) by mouth daily.

## 2020-06-15 NOTE — Assessment & Plan Note (Signed)
Chronic persistent, iron normal, will check Hgb electorphoresis next visit r/o thallasemia

## 2020-06-15 NOTE — Assessment & Plan Note (Signed)

## 2020-06-15 NOTE — Assessment & Plan Note (Signed)
Increased last few months with several stressors, pt to work on self referral to work counseling

## 2020-06-15 NOTE — Assessment & Plan Note (Signed)
Lab Results  Component Value Date   HGBA1C 6.4 06/07/2020   Stable, pt to continue current medical treatment  - diet  

## 2020-06-15 NOTE — Assessment & Plan Note (Signed)
Lab Results  Component Value Date   VITAMINB12 139 (L) 06/07/2020   Low to start oral replacement - b12 1000 mcg qd, then recheck next visit, if still low will need IM admin

## 2020-06-15 NOTE — Assessment & Plan Note (Signed)
Last vitamin D Lab Results  Component Value Date   VD25OH <7.00 (L) 06/07/2020   Low, to start oral replacement

## 2021-02-14 ENCOUNTER — Encounter: Payer: Self-pay | Admitting: Gastroenterology

## 2021-02-21 ENCOUNTER — Ambulatory Visit: Payer: No Typology Code available for payment source | Admitting: Internal Medicine

## 2021-04-11 ENCOUNTER — Other Ambulatory Visit: Payer: Self-pay

## 2021-04-11 ENCOUNTER — Ambulatory Visit (AMBULATORY_SURGERY_CENTER): Payer: No Typology Code available for payment source

## 2021-04-11 VITALS — Ht 64.0 in | Wt 180.0 lb

## 2021-04-11 DIAGNOSIS — K50919 Crohn's disease, unspecified, with unspecified complications: Secondary | ICD-10-CM

## 2021-04-11 MED ORDER — NA SULFATE-K SULFATE-MG SULF 17.5-3.13-1.6 GM/177ML PO SOLN: 1.0000 | Freq: Once | ORAL | 0 refills | Status: AC

## 2021-04-11 NOTE — Progress Notes (Signed)
No egg or soy allergy known to patient  No issues known to pt with past sedation with any surgeries or procedures Patient denies ever being told they had issues or difficulty with intubation  No FH of Malignant Hyperthermia Pt is not on diet pills Pt is not on home 02  Pt is not on blood thinners  Pt denies issues with constipation- opposite of issue (diarrhea) No A fib or A flutter Pt is fully vaccinated for Covid x 2; NO PA's for preps discussed with pt in PV today  Discussed with pt there will be an out-of-pocket cost for prep and that varies from $0 to 70 + dollars - pt verbalized understanding  Due to the COVID-19 pandemic we are asking patients to follow certain guidelines in PV and the [facility]   Pt aware of COVID protocols and [facility] guidelines  PV completed over the phone. Pt verified name, DOB, address and insurance during PV today.  Pt mailed instruction packet with copy of consent form to read and not return, and instructions.  Pt encouraged to call with questions or issues.  If pt has My chart, procedure instructions sent via My Chart

## 2021-04-25 ENCOUNTER — Encounter: Payer: No Typology Code available for payment source | Admitting: Gastroenterology

## 2021-05-01 ENCOUNTER — Telehealth: Payer: No Typology Code available for payment source | Admitting: Physician Assistant

## 2021-05-01 DIAGNOSIS — J019 Acute sinusitis, unspecified: Secondary | ICD-10-CM | POA: Diagnosis not present

## 2021-05-01 DIAGNOSIS — B9689 Other specified bacterial agents as the cause of diseases classified elsewhere: Secondary | ICD-10-CM | POA: Diagnosis not present

## 2021-05-01 MED ORDER — FLUTICASONE PROPIONATE 50 MCG/ACT NA SUSP: 2.0000 | Freq: Every day | NASAL | 0 refills | Status: DC

## 2021-05-01 MED ORDER — BENZONATATE 100 MG PO CAPS
100.0000 mg | ORAL_CAPSULE | Freq: Three times a day (TID) | ORAL | 0 refills | Status: DC | PRN
Start: 2021-05-01 — End: 2021-06-16

## 2021-05-01 MED ORDER — DOXYCYCLINE MONOHYDRATE 100 MG PO CAPS: 100.0000 mg | ORAL_CAPSULE | Freq: Two times a day (BID) | ORAL | 0 refills | Status: DC

## 2021-05-01 NOTE — Patient Instructions (Signed)
Bradley Donaldson, thank you for joining Leeanne Rio, PA-C for today's virtual visit.  While this provider is not your primary care provider (PCP), if your PCP is located in our provider database this encounter information will be shared with them immediately following your visit.  Consent: (Patient) Bradley Donaldson provided verbal consent for this virtual visit at the beginning of the encounter.  Current Medications:  Current Outpatient Medications:    amLODipine-olmesartan (AZOR) 5-40 MG tablet, Take 1 tablet by mouth daily., Disp: 90 tablet, Rfl: 3   Ascorbic Acid (VITAMIN C PO), Take 1 tablet by mouth daily at 6 (six) AM., Disp: , Rfl:    aspirin EC 81 MG tablet, Take 1 tablet (81 mg total) by mouth daily. (Patient not taking: Reported on 04/11/2021), Disp: 90 tablet, Rfl: 11   ELDERBERRY PO, Take 1 tablet by mouth daily at 6 (six) AM. GUMMY, Disp: , Rfl:    hydrochlorothiazide (HYDRODIURIL) 25 MG tablet, Take 0.5 tablets (12.5 mg total) by mouth daily., Disp: 45 tablet, Rfl: 3   Medications ordered in this encounter:  No orders of the defined types were placed in this encounter.    *If you need refills on other medications prior to your next appointment, please contact your pharmacy*  Follow-Up: Call back or seek an in-person evaluation if the symptoms worsen or if the condition fails to improve as anticipated.  Other Instructions Please take antibiotic as directed.  Increase fluid intake.  Use Saline nasal spray.  Take a daily multivitamin. Use the Flonase and Tessalon as directed. Start plain Mucinex to thin out congestion more.  Place a humidifier in the bedroom.  Please call or return clinic if symptoms are not improving.  Sinusitis Sinusitis is redness, soreness, and swelling (inflammation) of the paranasal sinuses. Paranasal sinuses are air pockets within the bones of your face (beneath the eyes, the middle of the forehead, or above the eyes). In healthy paranasal sinuses,  mucus is able to drain out, and air is able to circulate through them by way of your nose. However, when your paranasal sinuses are inflamed, mucus and air can become trapped. This can allow bacteria and other germs to grow and cause infection. Sinusitis can develop quickly and last only a short time (acute) or continue over a long period (chronic). Sinusitis that lasts for more than 12 weeks is considered chronic.  CAUSES  Causes of sinusitis include: Allergies. Structural abnormalities, such as displacement of the cartilage that separates your nostrils (deviated septum), which can decrease the air flow through your nose and sinuses and affect sinus drainage. Functional abnormalities, such as when the small hairs (cilia) that line your sinuses and help remove mucus do not work properly or are not present. SYMPTOMS  Symptoms of acute and chronic sinusitis are the same. The primary symptoms are pain and pressure around the affected sinuses. Other symptoms include: Upper toothache. Earache. Headache. Bad breath. Decreased sense of smell and taste. A cough, which worsens when you are lying flat. Fatigue. Fever. Thick drainage from your nose, which often is green and may contain pus (purulent). Swelling and warmth over the affected sinuses. DIAGNOSIS  Your caregiver will perform a physical exam. During the exam, your caregiver may: Look in your nose for signs of abnormal growths in your nostrils (nasal polyps). Tap over the affected sinus to check for signs of infection. View the inside of your sinuses (endoscopy) with a special imaging device with a light attached (endoscope), which is inserted into your  sinuses. If your caregiver suspects that you have chronic sinusitis, one or more of the following tests may be recommended: Allergy tests. Nasal culture A sample of mucus is taken from your nose and sent to a lab and screened for bacteria. Nasal cytology A sample of mucus is taken from your  nose and examined by your caregiver to determine if your sinusitis is related to an allergy. TREATMENT  Most cases of acute sinusitis are related to a viral infection and will resolve on their own within 10 days. Sometimes medicines are prescribed to help relieve symptoms (pain medicine, decongestants, nasal steroid sprays, or saline sprays).  However, for sinusitis related to a bacterial infection, your caregiver will prescribe antibiotic medicines. These are medicines that will help kill the bacteria causing the infection.  Rarely, sinusitis is caused by a fungal infection. In theses cases, your caregiver will prescribe antifungal medicine. For some cases of chronic sinusitis, surgery is needed. Generally, these are cases in which sinusitis recurs more than 3 times per year, despite other treatments. HOME CARE INSTRUCTIONS  Drink plenty of water. Water helps thin the mucus so your sinuses can drain more easily. Use a humidifier. Inhale steam 3 to 4 times a day (for example, sit in the bathroom with the shower running). Apply a warm, moist washcloth to your face 3 to 4 times a day, or as directed by your caregiver. Use saline nasal sprays to help moisten and clean your sinuses. Take over-the-counter or prescription medicines for pain, discomfort, or fever only as directed by your caregiver. SEEK IMMEDIATE MEDICAL CARE IF: You have increasing pain or severe headaches. You have nausea, vomiting, or drowsiness. You have swelling around your face. You have vision problems. You have a stiff neck. You have difficulty breathing. MAKE SURE YOU:  Understand these instructions. Will watch your condition. Will get help right away if you are not doing well or get worse. Document Released: 02/23/2005 Document Revised: 05/18/2011 Document Reviewed: 03/10/2011 [facility] Patient Information 2014 [place], [place].    If you have been instructed to have an in-person evaluation today at a local Urgent Care  facility, please use the link below. It will take you to a list of all of our available [facility], including address, phone number and hours of operation. Please do not delay care.  [facility]  If you or a family member do not have a primary care provider, use the link below to schedule a visit and establish care. When you choose a [facility] primary care physician or advanced practice provider, you gain a long-term partner in health. Find a Primary Care Provider  Learn more about [facility]'s in-office and virtual care options: [facility]

## 2021-05-01 NOTE — Progress Notes (Signed)
Virtual Visit Consent   Cicero Noy, you are scheduled for a virtual visit with a [facility] provider today.     Just as with appointments in the office, your consent must be obtained to participate.  Your consent will be active for this visit and any virtual visit you may have with one of our providers in the next 365 days.     If you have a MyChart account, a copy of this consent can be sent to you electronically.  All virtual visits are billed to your insurance company just like a traditional visit in the office.    As this is a virtual visit, video technology does not allow for your provider to perform a traditional examination.  This may limit your provider's ability to fully assess your condition.  If your provider identifies any concerns that need to be evaluated in person or the need to arrange testing (such as labs, EKG, etc.), we will make arrangements to do so.     Although advances in technology are sophisticated, we cannot ensure that it will always work on either your end or our end.  If the connection with a video visit is poor, the visit may have to be switched to a telephone visit.  With either a video or telephone visit, we are not always able to ensure that we have a secure connection.     I need to obtain your verbal consent now.   Are you willing to proceed with your visit today?    Bradley Donaldson has provided verbal consent on 05/01/2021 for a virtual visit (video or telephone).   Leeanne Rio, [place]   Date: 05/01/2021 10:08 AM   Virtual Visit via Video Note   I, Leeanne Rio, connected with  Bradley Donaldson  ([id number], Feb 26, 1971) on 05/01/21 at 10:00 AM EST by a video-enabled telemedicine application and verified that I am speaking with the correct person using two identifiers.  Location: Patient: Virtual Visit Location Patient: Home Provider: Virtual Visit Location Provider: Home Office   I discussed the limitations of evaluation and management by  telemedicine and the availability of in person appointments. The patient expressed understanding and agreed to proceed.    History of Present Illness: Bradley Donaldson is a 51 y.o. who identifies as a male who was assigned male at birth, and is being seen today for URI symptoms starting about few. Notes having COVID at the end of January. Got over symptoms after about a week. Felt good for another week and then noted head and nasal congestion with sinus pressure, tickle in throat that is causing this ongoing cough. Mucous has now become thicker and cloudy. Denies fever, chills or body aches. Denies chest tightness, SOB or wheezing. Denies ear pain. Has taken Allegra for his seasonal allergies along with Ibuprofen.   HPI: HPI  Problems:  Patient Active Problem List   Diagnosis Date Noted   Anxiety 06/15/2020   Vitamin D deficiency 06/15/2020   B12 deficiency 06/15/2020   Low mean corpuscular volume (MCV) 06/15/2020   Fever 06/05/2019   Hyperglycemia 03/19/2017   Erectile dysfunction 02/27/2016   Left flank pain 04/09/2015   Skin nodule 04/09/2015   Microhematuria 04/09/2015   Itching 01/30/2014   Rash and nonspecific skin eruption 01/30/2014   OSA (obstructive sleep apnea) 08/28/2010   Encounter for well adult exam with abnormal findings 06/18/2010   Incisional hernia 06/18/2010   Goiter 06/18/2010   Seborrheic dermatitis of scalp 12/09/2007   ANEMIA-IRON DEFICIENCY  03/18/2007   ALLERGIC RHINITIS 03/18/2007   Essential hypertension 01/04/2007   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/2007    Allergies:  Allergies  Allergen Reactions   Ranitidine Hcl    Medications:  Current Outpatient Medications:    benzonatate (TESSALON) 100 MG capsule, Take 1 capsule (100 mg total) by mouth 3 (three) times daily as needed for cough., Disp: 30 capsule, Rfl: 0   doxycycline (MONODOX) 100 MG capsule, Take 1 capsule (100 mg total) by mouth 2 (two) times daily., Disp: 20 capsule, Rfl: 0   fluticasone  (FLONASE) 50 MCG/ACT nasal spray, Place 2 sprays into both nostrils daily., Disp: 16 g, Rfl: 0   amLODipine-olmesartan (AZOR) 5-40 MG tablet, Take 1 tablet by mouth daily., Disp: 90 tablet, Rfl: 3   Ascorbic Acid (VITAMIN C PO), Take 1 tablet by mouth daily at 6 (six) AM., Disp: , Rfl:    aspirin EC 81 MG tablet, Take 1 tablet (81 mg total) by mouth daily. (Patient not taking: Reported on 04/11/2021), Disp: 90 tablet, Rfl: 11   ELDERBERRY PO, Take 1 tablet by mouth daily at 6 (six) AM. GUMMY, Disp: , Rfl:    hydrochlorothiazide (HYDRODIURIL) 25 MG tablet, Take 0.5 tablets (12.5 mg total) by mouth daily., Disp: 45 tablet, Rfl: 3  Observations/Objective: Patient is well-developed, well-nourished in no acute distress.  Resting comfortably at home.  Head is normocephalic, atraumatic.  No labored breathing. Speech is clear and coherent with logical content.  Patient is alert and oriented at baseline.   Assessment and Plan: 1. Acute bacterial sinusitis - fluticasone (FLONASE) 50 MCG/ACT nasal spray; Place 2 sprays into both nostrils daily.  Dispense: 16 g; Refill: 0 - benzonatate (TESSALON) 100 MG capsule; Take 1 capsule (100 mg total) by mouth 3 (three) times daily as needed for cough.  Dispense: 30 capsule; Refill: 0 - doxycycline (MONODOX) 100 MG capsule; Take 1 capsule (100 mg total) by mouth 2 (two) times daily.  Dispense: 20 capsule; Refill: 0  Suspect festering sinus infection since COVID last month. Wills tart Doxycycline. Continue allergy medications. Flonase and Tessalon per orders. In-person evaluation if not resolving or any new or worsening symptoms despite treatment.   Follow Up Instructions: I discussed the assessment and treatment plan with the patient. The patient was provided an opportunity to ask questions and all were answered. The patient agreed with the plan and demonstrated an understanding of the instructions.  A copy of instructions were sent to the patient via MyChart unless  otherwise noted below.    The patient was advised to call back or seek an in-person evaluation if the symptoms worsen or if the condition fails to improve as anticipated.  Time:  I spent 10 minutes with the patient via telehealth technology discussing the above problems/concerns.    Leeanne Rio, PA-C

## 2021-05-06 ENCOUNTER — Encounter: Payer: Self-pay | Admitting: Gastroenterology

## 2021-05-09 ENCOUNTER — Ambulatory Visit (AMBULATORY_SURGERY_CENTER): Payer: No Typology Code available for payment source | Admitting: Gastroenterology

## 2021-05-09 ENCOUNTER — Other Ambulatory Visit (INDEPENDENT_AMBULATORY_CARE_PROVIDER_SITE_OTHER): Payer: No Typology Code available for payment source

## 2021-05-09 ENCOUNTER — Encounter: Payer: Self-pay | Admitting: Gastroenterology

## 2021-05-09 ENCOUNTER — Other Ambulatory Visit: Payer: Self-pay

## 2021-05-09 VITALS — BP 101/65 | HR 90 | Temp 97.3°F | Resp 16 | Ht 64.0 in | Wt 180.0 lb

## 2021-05-09 DIAGNOSIS — K9189 Other postprocedural complications and disorders of digestive system: Secondary | ICD-10-CM

## 2021-05-09 DIAGNOSIS — K50919 Crohn's disease, unspecified, with unspecified complications: Secondary | ICD-10-CM

## 2021-05-09 LAB — CBC WITH DIFFERENTIAL/PLATELET
Basophils Absolute: 0.1 10*3/uL (ref 0.0–0.1)
Basophils Relative: 0.9 % (ref 0.0–3.0)
Eosinophils Absolute: 0.2 10*3/uL (ref 0.0–0.7)
Eosinophils Relative: 1.6 % (ref 0.0–5.0)
HCT: 36.3 % — ABNORMAL LOW (ref 39.0–52.0)
Hemoglobin: 11.5 g/dL — ABNORMAL LOW (ref 13.0–17.0)
Lymphocytes Relative: 23.6 % (ref 12.0–46.0)
Lymphs Abs: 2.4 10*3/uL (ref 0.7–4.0)
MCHC: 31.6 g/dL (ref 30.0–36.0)
MCV: 68.7 fl — ABNORMAL LOW (ref 78.0–100.0)
Monocytes Absolute: 0.9 10*3/uL (ref 0.1–1.0)
Monocytes Relative: 8.7 % (ref 3.0–12.0)
Neutro Abs: 6.6 10*3/uL (ref 1.4–7.7)
Neutrophils Relative %: 65.2 % (ref 43.0–77.0)
Platelets: 412 10*3/uL — ABNORMAL HIGH (ref 150.0–400.0)
RBC: 5.29 Mil/uL (ref 4.22–5.81)
RDW: 16.1 % — ABNORMAL HIGH (ref 11.5–15.5)
WBC: 10.2 10*3/uL (ref 4.0–10.5)

## 2021-05-09 LAB — COMPREHENSIVE METABOLIC PANEL
ALT: 17 U/L (ref 0–53)
AST: 15 U/L (ref 0–37)
Albumin: 4 g/dL (ref 3.5–5.2)
Alkaline Phosphatase: 58 U/L (ref 39–117)
BUN: 13 mg/dL (ref 6–23)
CO2: 27 mEq/L (ref 19–32)
Calcium: 8.9 mg/dL (ref 8.4–10.5)
Chloride: 103 mEq/L (ref 96–112)
Creatinine, Ser: 1.31 mg/dL (ref 0.40–1.50)
GFR: 63.22 mL/min (ref >60.00)
Glucose, Bld: 85 mg/dL (ref 70–99)
Potassium: 3.8 mEq/L (ref 3.5–5.1)
Sodium: 139 mEq/L (ref 135–145)
Total Bilirubin: 0.7 mg/dL (ref 0.2–1.2)
Total Protein: 7.8 g/dL (ref 6.0–8.3)

## 2021-05-09 LAB — C-REACTIVE PROTEIN: CRP: 3.7 mg/dL (ref 0.5–20.0)

## 2021-05-09 LAB — VITAMIN B12: Vitamin B-12: 228 pg/mL (ref 211–911)

## 2021-05-09 MED ORDER — SODIUM CHLORIDE 0.9 % IV SOLN: 500.0000 mL | INTRAVENOUS | Status: DC

## 2021-05-09 NOTE — Progress Notes (Signed)
Pt's states no medical or surgical changes since previsit or office visit. 

## 2021-05-09 NOTE — Patient Instructions (Signed)
Resume previous diet and continue present medications.  ? ? NO ASPIRIN, ASPIRIN CONTAINING PRODUCTS (BC OR GOODY POWDERS) OR NSAIDS (IBUPROFEN, ADVIL, ALEVE, AND MOTRIN).  Await pathology results.  Return to GI clinic (app with Nevin Bloodgood) in 4-6 weeks.  Check CBC, CMP, CRP, B12 today in Lab.   ? ?YOU HAD AN ENDOSCOPIC PROCEDURE TODAY AT [facility]:   Refer to the procedure report that was given to you for any specific questions about what was found during the examination.  If the procedure report does not answer your questions, please call your gastroenterologist to clarify.  If you requested that your care partner not be given the details of your procedure findings, then the procedure report has been included in a sealed envelope for you to review at your convenience later. ? ?YOU SHOULD EXPECT: Some feelings of bloating in the abdomen. Passage of more gas than usual.  Walking can help get rid of the air that was put into your GI tract during the procedure and reduce the bloating. If you had a lower endoscopy (such as a colonoscopy or flexible sigmoidoscopy) you may notice spotting of blood in your stool or on the toilet paper. If you underwent a bowel prep for your procedure, you may not have a normal bowel movement for a few days. ? ?Please Note:  You might notice some irritation and congestion in your nose or some drainage.  This is from the oxygen used during your procedure.  There is no need for concern and it should clear up in a day or so. ? ?SYMPTOMS TO REPORT IMMEDIATELY: ? ?Following lower endoscopy (colonoscopy or flexible sigmoidoscopy): ? Excessive amounts of blood in the stool ? Significant tenderness or worsening of abdominal pains ? Swelling of the abdomen that is new, acute ? Fever of 100?F or higher ? ? ?For urgent or emergent issues, a gastroenterologist can be reached at any hour by calling [phone]. ?Do not use MyChart messaging for urgent concerns.  ? ? ?DIET:  We do  recommend a small meal at first, but then you may proceed to your regular diet.  Drink plenty of fluids but you should avoid alcoholic beverages for 24 hours. ? ?ACTIVITY:  You should plan to take it easy for the rest of today and you should NOT DRIVE or use heavy machinery until tomorrow (because of the sedation medicines used during the test).   ? ?FOLLOW UP: ?Our staff will call the number listed on your records 48-72 hours following your procedure to check on you and address any questions or concerns that you may have regarding the information given to you following your procedure. If we do not reach you, we will leave a message.  We will attempt to reach you two times.  During this call, we will ask if you have developed any symptoms of COVID 19. If you develop any symptoms (ie: fever, flu-like symptoms, shortness of breath, cough etc.) before then, please call [phone].  If you test positive for Covid 19 in the 2 weeks post procedure, please call and report this information to [place].   ? ?If any biopsies were taken you will be contacted by phone or by letter within the next 1-3 weeks.  Please call us at [phone] if you have not heard about the biopsies in 3 weeks.  ? ? ?SIGNATURES/CONFIDENTIALITY: ?You and/or your care partner have signed paperwork which will be entered into your electronic medical record.  These signatures attest to  the fact that that the information above on your After Visit Summary has been reviewed and is understood.  Full responsibility of the confidentiality of this discharge information lies with you and/or your care-partner.  ?

## 2021-05-09 NOTE — Progress Notes (Signed)
Called to room to assist during endoscopic procedure.  Patient ID and intended procedure confirmed with present staff. Received instructions for my participation in the procedure from the performing physician.  

## 2021-05-09 NOTE — Progress Notes (Signed)
PT taken to PACU. Monitors in place. VSS. Report given to RN. 

## 2021-05-09 NOTE — Progress Notes (Signed)
[facility] History and Physical ? ? ?Primary Care Physician:  Biagio Borg, MD ? ? ?Reason for Procedure:   Crohn's s/p R  hemicolectomy 1990s. Prev colon with  anastomotic erosions. Not  taking any meds for Crohn's. ? ?Plan:     colonoscopy ? ? ? ? ?HPI: Bradley Donaldson is a 51 y.o. male  ? ? ?Past Medical History:  ?Diagnosis Date  ? ALLERGIC RHINITIS 03/18/2007  ? ANEMIA-IRON DEFICIENCY 03/18/2007  ? hx of  ? Anxiety   ? work related stress-  ? CROHN'S DISEASE 01/04/2007  ? DERMATITIS, SCALP 12/09/2007  ? GERD (gastroesophageal reflux disease)   ? OTC meds PRN  ? HYPERTENSION 01/04/2007  ? on meds  ? Incisional hernia 06/18/2010  ? Seasonal allergies   ? Sleep apnea   ? uses CPAP  ? URI 12/09/2007  ? ? ?Past Surgical History:  ?Procedure Laterality Date  ? COLONOSCOPY  2019  ? RG-prep exc  ? partial colon removal  1998  ? Right hemicolectomy  ? ? ?Prior to Admission medications   ?Medication Sig Start Date End Date Taking? Authorizing Provider  ?amLODipine-olmesartan (AZOR) 5-40 MG tablet Take 1 tablet by mouth daily. 06/14/20  Yes Biagio Borg, MD  ?fluticasone [facility]) 50 MCG/ACT nasal spray Place 2 sprays into both nostrils daily. 05/01/21  Yes Brunetta Jeans, PA-C  ?hydrochlorothiazide (HYDRODIURIL) 25 MG tablet Take 0.5 tablets (12.5 mg total) by mouth daily. 06/14/20  Yes Biagio Borg, MD  ?Ascorbic Acid (VITAMIN C PO) Take 1 tablet by mouth daily at 6 (six) AM.    [provider]  ?aspirin EC 81 MG tablet Take 1 tablet (81 mg total) by mouth daily. ?Patient not taking: Reported on 04/11/2021 04/09/15   Biagio Borg, MD  ?benzonatate (TESSALON) 100 MG capsule Take 1 capsule (100 mg total) by mouth 3 (three) times daily as needed for cough. 05/01/21   Brunetta Jeans, PA-C  ?ELDERBERRY PO Take 1 tablet by mouth daily at 6 (six) AM. GUMMY    [provider]  ?fexofenadine (ALLEGRA) 60 MG tablet  04/28/21   [provider]  ? ? ?Current Outpatient Medications  ?Medication  Sig Dispense Refill  ? amLODipine-olmesartan (AZOR) 5-40 MG tablet Take 1 tablet by mouth daily. 90 tablet 3  ? fluticasone (FLONASE) 50 MCG/ACT nasal spray Place 2 sprays into both nostrils daily. 16 g 0  ? hydrochlorothiazide (HYDRODIURIL) 25 MG tablet Take 0.5 tablets (12.5 mg total) by mouth daily. 45 tablet 3  ? Ascorbic Acid (VITAMIN C PO) Take 1 tablet by mouth daily at 6 (six) AM.    ? aspirin EC 81 MG tablet Take 1 tablet (81 mg total) by mouth daily. (Patient not taking: Reported on 04/11/2021) 90 tablet 11  ? benzonatate (TESSALON) 100 MG capsule Take 1 capsule (100 mg total) by mouth 3 (three) times daily as needed for cough. 30 capsule 0  ? ELDERBERRY PO Take 1 tablet by mouth daily at 6 (six) AM. GUMMY    ? fexofenadine (ALLEGRA) 60 MG tablet  (Patient not taking: Reported on 05/09/2021)    ? ?Current Facility-Administered Medications  ?Medication Dose Route Frequency Provider Last Rate Last Admin  ? 0.9 %  sodium chloride infusion  500 mL Intravenous Continuous Jackquline Denmark, MD      ? ? ?Allergies as of 05/09/2021 - Review Complete 05/09/2021  ?Allergen Reaction Noted  ? Ranitidine hcl  03/18/2007  ? ? ?Family History  ?Problem Relation Age  of Onset  ? Heart disease Father   ? Hypertension Other   ? Colon cancer Neg Hx   ? Esophageal cancer Neg Hx   ? Rectal cancer Neg Hx   ? Stomach cancer Neg Hx   ? Colon polyps Neg Hx   ? ? ?Social History  ? ?Socioeconomic History  ? Marital status: Married  ?  Spouse name: Not on file  ? Number of children: 2  ? Years of education: Not on file  ? Highest education level: Not on file  ?Occupational History  ? Occupation: works at home  ?Tobacco Use  ? Smoking status: Never  ? Smokeless tobacco: Never  ?Vaping Use  ? Vaping Use: Never used  ?Substance and Sexual Activity  ? Alcohol use: Not Currently  ?  Alcohol/week: 0.0 - 1.0 standard drinks  ? Drug use: No  ? Sexual activity: Not on file  ?Other Topics Concern  ? Not on file  ?Social History Narrative  ? Not on  file  ? ?Social Determinants of Health  ? ?Financial Resource Strain: Not on file  ?Food Insecurity: Not on file  ?Transportation Needs: Not on file  ?Physical Activity: Not on file  ?Stress: Not on file  ?Social Connections: Not on file  ?Intimate Partner Violence: Not on file  ? ? ?Review of Systems: ?Positive for none ?All other review of systems negative except as mentioned in the HPI. ? ?Physical Exam: ?Vital signs in last 24 hours: ?@VSRANGES @ ?  ?General:   Alert,  Well-developed, well-nourished, pleasant and cooperative in NAD ?Lungs:  Clear throughout to auscultation.   ?Heart:  Regular rate and rhythm; no murmurs, clicks, rubs,  or gallops. ?Abdomen:  Soft, nontender and nondistended. Normal bowel sounds.   ?Neuro/Psych:  Alert and cooperative. Normal mood and affect. A and O x 3 ? ? ? ?No significant changes were identified.  The patient continues to be an appropriate candidate for the planned procedure and anesthesia. ? ? ?Carmell Austria, MD. ?[facility] ?05/09/2021 11:04 AM@ ? ?

## 2021-05-09 NOTE — Op Note (Addendum)
[facility] ?Patient Name: Bradley Donaldson ?Procedure Date: 05/09/2021 11:00 AM ?MRN: [id number] ?Endoscopist: Jackquline Denmark , MD ?Age: 51 ?Referring MD:  ?Date of Birth: 06-15-1970 ?Gender: Male ?Account #: [id number] ?Procedure:                Colonoscopy ?Indications:              H/O Crohn's disease s/p right hemicolectomy 1990s.  ?                          Occasional intermittent diarrhea without blood in  ?                          the stool. Iron deficiency without anemia. Not on  ?                          any medicines for Crohn's ?Medicines:                Monitored Anesthesia Care ?Procedure:                Pre-Anesthesia Assessment: ?                          - Prior to the procedure, a History and Physical  ?                          was performed, and patient medications and  ?                          allergies were reviewed. The patient's tolerance of  ?                          previous anesthesia was also reviewed. The risks  ?                          and benefits of the procedure and the sedation  ?                          options and risks were discussed with the patient.  ?                          All questions were answered, and informed consent  ?                          was obtained. Prior Anticoagulants: The patient has  ?                          taken no previous anticoagulant or antiplatelet  ?                          agents. ASA Grade Assessment: II - A patient with  ?                          mild systemic disease. After reviewing the risks  ?  and benefits, the patient was deemed in  ?                          satisfactory condition to undergo the procedure. ?                          After obtaining informed consent, the colonoscope  ?                          was passed under direct vision. Throughout the  ?                          procedure, the patient's blood pressure, pulse, and  ?                          oxygen saturations were monitored  continuously. The  ?                          CF [id number] #[id number] was introduced through the anus  ?                          and advanced to the the ileocolonic anastomosis.  ?                          The colonoscopy was performed without difficulty.  ?                          The patient tolerated the procedure well. The  ?                          quality of the bowel preparation was good. The  ?                          neo-terminal ileum, Ileocecal anastomosis and  ?                          rectum were photographed. ?Scope In: 11:12:31 AM ?Scope Out: 11:23:03 AM ?Scope Withdrawal Time: 0 hours 8 minutes 44 seconds  ?Total Procedure Duration: 0 hours 10 minutes 32 seconds  ?Findings:                 There was evidence of a prior end-to-side  ?                          ileo-colonic anastomosis in the cecum. This was  ?                          patent and was characterized by erosions,  ?                          inflammation and ulcerations (max 15mm-1cm). No  ?                          stenosis. Biopsies were taken with a cold forceps  ?  for histology. ?                          Non-bleeding internal hemorrhoids were found during  ?                          retroflexion. The hemorrhoids were small. ?                          The neo-terminal ileum appeared normal except for  ?                          2-3 4 mm erosions in the distal most neo-TI. ?                          The exam was otherwise without abnormality on  ?                          direct and retroflexion views. ?                          The Simple Endoscopic Score for Crohn's Disease was  ?                          determined based on the endoscopic appearance of  ?                          the mucosa in the following segments: ?                          - Ileum: Findings include large ulcers 0.5-2 cm in  ?                          size, less than 10% ulcerated surfaces, less than  ?                          50% of  surfaces affected and no narrowings. Segment  ?                          score: 4. ?Complications:            No immediate complications. ?Estimated Blood Loss:     Estimated blood loss: none. ?Impression:               - Ileo-colonic anastomosis, characterized by  ?                          erosion, inflammation and ulceration s/o  ?                          anastomotic recurrence. Biopsied. ?                          - No perianal Crohn's disease. ?                          -  The examined portion of the ileum was normal. ?                          - The examination was otherwise normal on direct  ?                          and retroflexion views. ?Recommendation:           - Patient has a contact number available for  ?                          emergencies. The signs and symptoms of potential  ?                          delayed complications were discussed with the  ?                          patient. Return to normal activities tomorrow.  ?                          Written discharge instructions were provided to the  ?                          patient. ?                          - Resume previous diet. ?                          - Continue present medications. ?                          - No aspirin, ibuprofen, naproxen, or other  ?                          non-steroidal anti-inflammatory drugs. ?                          - Await pathology results. ?                          - Return to GI clinic (APP with Nevin Bloodgood) in 4-6 weeks. ?                          - Check CBC, CMP, CRP, B12 today. ?                          - Check TB Gold and hepatitis B surface antigen  ?                          today in anticipation of biologic therapy ?                          - The findings and recommendations were discussed  ?  with the patient's wife in detail. ?Jackquline Denmark, MD ?05/09/2021 11:38:23 AM ?This report has been signed electronically. ?

## 2021-05-13 ENCOUNTER — Telehealth: Payer: Self-pay

## 2021-05-13 ENCOUNTER — Telehealth: Payer: Self-pay | Admitting: *Deleted

## 2021-05-13 NOTE — Telephone Encounter (Signed)
Per Recommendation from Colonoscopy report: Pt was scheduled for an Follow up with Tye Savoy NP on 06/16/2021 at 9:00: Pt made aware: ?Pt verbalized understanding with all questions answered.  ? ?

## 2021-05-13 NOTE — Telephone Encounter (Signed)
?  Follow up Call- ? ?Call back number 05/09/2021  ?Post procedure Call Back phone  # [phone]  ?Permission to leave phone message Yes  ?Some recent data might be hidden  ?  ? ?Patient questions: ? ?Do you have a fever, pain , or abdominal swelling? No. ?Pain Score  0 * ? ?Have you tolerated food without any problems? Yes.   ? ?Have you been able to return to your normal activities? Yes.   ? ?Do you have any questions about your discharge instructions: ?Diet   No. ?Medications  No. ?Follow up visit  No. ? ?Do you have questions or concerns about your Care? No. ? ?Actions: ?* If pain score is 4 or above: ?No action needed, pain <4. ? ? ?

## 2021-05-15 LAB — QUANTIFERON-TB GOLD PLUS
Mitogen-NIL: >10 IU/mL
NIL: 0.06 IU/mL
QuantiFERON-TB Gold Plus: NEGATIVE
TB1-NIL: <0 IU/mL
TB2-NIL: <0 IU/mL

## 2021-05-15 LAB — HEPATITIS B SURFACE ANTIGEN: Hepatitis B Surface Ag: NONREACTIVE

## 2021-05-18 ENCOUNTER — Encounter: Payer: Self-pay | Admitting: Gastroenterology

## 2021-05-18 NOTE — Progress Notes (Signed)
Crohn's disease with ileocolic anastomosis recurrence. ?Negative TB Gold, HBsAg ?Nl B12 ?Had IDA ? ?Plan:  ?- Start Humira Day 1- '160mg'$ , day 15 - '80mg'$ , then '40mg'$  SQ every other week, starting day 29. ?- Needs pneumococcal vaccine while we are waiting for Humira approval. ?- Start iron sulfate 1 tablet p.o. once a day ?- Make sure he has FU appt with Nevin Bloodgood ? ? ?

## 2021-05-19 ENCOUNTER — Other Ambulatory Visit: Payer: Self-pay

## 2021-05-19 ENCOUNTER — Other Ambulatory Visit (HOSPITAL_COMMUNITY): Payer: Self-pay

## 2021-05-19 DIAGNOSIS — D509 Iron deficiency anemia, unspecified: Secondary | ICD-10-CM

## 2021-05-19 DIAGNOSIS — K50919 Crohn's disease, unspecified, with unspecified complications: Secondary | ICD-10-CM

## 2021-05-19 MED ORDER — IRON (FERROUS SULFATE) 325 (65 FE) MG PO TABS: 325.0000 mg | ORAL_TABLET | Freq: Every day | ORAL | 1 refills | Status: DC

## 2021-05-19 MED ORDER — HUMIRA 40 MG/0.8ML ~~LOC~~ PSKT
PREFILLED_SYRINGE | SUBCUTANEOUS | 6 refills | Status: DC
  Filled 2021-05-19: qty 2, fill #0

## 2021-05-19 MED ORDER — HUMIRA-CD/UC/HS STARTER 40 MG/0.8ML ~~LOC~~ PNKT
PEN_INJECTOR | SUBCUTANEOUS | 0 refills | Status: DC
  Filled 2021-05-19: qty 6, fill #0

## 2021-05-20 ENCOUNTER — Other Ambulatory Visit (HOSPITAL_COMMUNITY): Payer: Self-pay

## 2021-06-03 ENCOUNTER — Telehealth: Payer: Self-pay

## 2021-06-03 NOTE — Telephone Encounter (Signed)
A PA was recently requested  for this pt on 05/29/2021 to start Humira:  ?This pt was denied to to the fact that there is limited information in regard to records on this pt and also medications that pt has tried prior and failed. Last office note from our office was from 2019:  ?Pt is scheduled to see Tye Savoy NP on 06/16/2021 at 9:00 ?PA will be resubmitted after office visit and notes completed if Humira is still to be requested:  ? ? ? ?

## 2021-06-16 ENCOUNTER — Ambulatory Visit: Payer: No Typology Code available for payment source | Admitting: Nurse Practitioner

## 2021-06-16 ENCOUNTER — Other Ambulatory Visit (INDEPENDENT_AMBULATORY_CARE_PROVIDER_SITE_OTHER): Payer: No Typology Code available for payment source

## 2021-06-16 ENCOUNTER — Encounter: Payer: Self-pay | Admitting: Nurse Practitioner

## 2021-06-16 VITALS — BP 136/82 | HR 81 | Ht 64.0 in | Wt 190.4 lb

## 2021-06-16 DIAGNOSIS — D509 Iron deficiency anemia, unspecified: Secondary | ICD-10-CM | POA: Diagnosis not present

## 2021-06-16 DIAGNOSIS — K50919 Crohn's disease, unspecified, with unspecified complications: Secondary | ICD-10-CM

## 2021-06-16 LAB — FERRITIN: Ferritin: 93 ng/mL (ref 22.0–322.0)

## 2021-06-16 NOTE — Patient Instructions (Addendum)
Will contact you about Crohn's medication ? ?Will contact you about lab results and possible referral to Hematology ? ?We have scheduled you a follow up with Dr. Lyndel Safe on 07/29/21 at 8:30 am. ? ?Please proceed to the basement level for lab work before leaving today. Press "B" on the elevator. The lab is located at the first door on the left as you exit the elevator. ? ?HEALTHCARE LAWS AND MY CHART RESULTS:  ? ?Due to recent changes in healthcare laws, you may see results of your imaging and/or laboratory studies on MyChart before I have had a chance to review them.  I understand that in some cases there may be results that are confusing or concerning to you. Please understand that not all results are received at the same time and often I may need to interpret multiple results in order to provide you with the best plan of care or course of treatment. Therefore, I ask that you please give me 48 hours to thoroughly review all your results before contacting my office for clarification.  ? ?Thank you for trusting me with your gastrointestinal care!   ? ?Bradley Savoy, NP ? ?BMI: ? ?If you are age 51 or older, your body mass index should be between 23-30. Your Body mass index is 32.68 kg/m?Marland Kitchen If this is out of the aforementioned range listed, please consider follow up with your Primary Care Provider. ? ?If you are age 78 or younger, your body mass index should be between 19-25. Your Body mass index is 32.68 kg/m?Marland Kitchen If this is out of the aformentioned range listed, please consider follow up with your Primary Care Provider.  ? ?MY CHART: ? ?The [facility] providers would like to encourage you to use [facility] to communicate with providers for non-urgent requests or questions.  Due to long hold times on the telephone, sending your provider a message by [facility] may be a faster and more efficient way to get a response.  Please allow 48 business hours for a response.  Please remember that this is for non-urgent requests.  ? ?

## 2021-06-16 NOTE — Progress Notes (Signed)
? ? ? ?Assessment  ? ?Patient Profile:  ?Bradley Donaldson is a 51 y.o. male known followed by Dr. Lyndel Safe with a past medical history of Crohn's ileitis s/p remote resection with reoccurrence of disease at anastomosis, OSA and HTN.  Additional medical history as listed in [facility] [place] . ? ?Crohn's ileitis s/p remote resection. Reoccurrence of disease at anastomosis March 2023. Biopsies compatible with chronic active ileitis. No Crohn's treatment in 30 years. Humira recently denied by [employer]. Clinically he is doing well without treatment   ?Microcytic anemia, ? Iron deficiency, ? Thalassemia. Not tolerating oral iron due to constipation and abdominal cramping Last dose a month ago.  ? ?History of B12 deficiency ( ileal resection).  Currently B12 is in low normal range at 228.  ? ? ?Plan  ? ?Check iron studies today If iron deficient then refer to Hematology for IV iron since not tolerating oral iron. Also, Hematology can evaluate for possible Thalasemia as patient mentions that his PCP mentioned that he could have.  ?Will discuss other treatment options with Dr. Lyndel Safe since Humira is not an option right now. Will get in touch with patient about treatment plan  ?Follow up with Dr. Lyndel Safe in 4 -6 weeks  ? ? ?History of Present Illness  ? ?Chief Complaint : follow up on Crohn's disease ? ? ?July 2019  ?Office visit to re-establish care. He was having loose , non-bloody loose stools. He recalled Dr. Fuller Plan diagnosing him with Crohn's disease in the 1990's. (Records were not available).  He was scheduled for a colonoscopy with Dr. Fuller Plan but due to scheduling Dr. Lyndel Safe performed the procedure. In the interim his old colonoscopy report from 1997 was obtained. It was actually Dr. 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  Colon biopsy ( ? from cecum)  showed hemorrhagic, nonspecific colitis.  Focal increase in neutrophils and eosinophils.  Cryptitis and crypt abscesses not seen.  There  appears to be more than expected Panth cells.  Findings are nonspecific but the possibility of inflammatory bowel disease cannot be excluded.  He underwent colon resection. Specimen path compatible with Crohn's enteritis   ? ?Oct 2019 colonoscopy showing erosions at the anastomosis suggestive of postoperative recurrence. Mild endoscopic activity.  Biopsies compatible with chronic active ileitis.  Plan was start treatment and have a surveillance colonoscopy in colonoscopy in 3 years. For unclear reasons there was no office follow up. He did return for the 3-year follow-up colonoscopy in March 2023 with Dr.Gupta which again showed recurrent disease at the anastomosis.  Plan was to start Humira but it was denied by the insurance company.  ? ? ?Interval history : ?Stools are solid about 80% of the time, remainder of time they are loose but he feels like that is diet related. No blood in stools. No abdominal pain. CRP normal. No joint aches. Sees Dermatology for rash on scalp and face, no rashes / skin lesions on extremities.  He does complain of feeling tired but otherwise feels okay. ? ?Patient was started on oral iron early March. Hgb was 11.5, MCV 68. Not tolerating iron as it causes constipation and abdominal cramping. Told by PCP that he could have Thalassemia.  ? ?Laboratory data: ? ?  Latest Ref Rng & Units 05/09/2021  ? 12:18 PM 06/07/2020  ? 12:00 PM 05/24/2019  ?  4:40 PM  ?Hepatic Function  ?Total Protein 6.0 - 8.3 g/dL 7.8   8.2   7.8    ?  Albumin 3.5 - 5.2 g/dL 4.0   4.6   4.3    ?AST 0 - 37 U/L _0 ?ALT 0 - 53 U/L _1 ?Alk Phosphatase 39 - 117 U/L 58   61   59    ?Total Bilirubin 0.2 - 1.2 mg/dL 0.7   0.6   0.5    ?Bilirubin, Direct 0.0 - 0.3 mg/dL  0.1   0.1    ? ? ? ?  Latest Ref Rng & Units 05/09/2021  ? 12:18 PM 06/07/2020  ? 12:00 PM 05/24/2019  ?  4:40 PM  ?CBC  ?WBC 4.0 - 10.5 K/uL 10.2   9.6   10.1    ?Hemoglobin 13.0 - 17.0 g/dL 11.5   13.1   12.4    ?Hematocrit 39.0 - 52.0 % 36.3    41.1   38.7    ?Platelets 150.0 - 400.0 K/uL 412.0   340.0   344.0    ? ? ?Previous GI Evaluations  ? ?Endoscopies:  ?Colonoscopy Oct 2019 ?- Erosions at the anastomosis suggestive of postoperative recurrence. Mild endoscopic activity (biopsied) ?-Non-bleeding internal hemorrhoids ?1. Surgical [P], terminal ileum ?- CHRONIC ACTIVE ILEITIS ?- NO GRANULOMATA, DYSPLASIA OR MALIGNANCY IDENTIFIED ?2. Surgical [P], random sites ?- BENIGN COLONIC MUCOSA ?- NO ACTIVE INFLAMMATION, DYSPLASIA OR MALIGNANCY  ? ?March 2023 Colonoscopy  ?- Ileo-colonic anastomosis, characterized by erosion, inflammation and ulceration s/o anastomotic recurrence. Biopsied. ?- No perianal Crohn's disease. ?- The examined portion of the ileum was normal. ?- The examination was otherwise normal on direct and retroflexion views. ?Surgical [P], small bowel, ileocecal anastomosis- history of Crohn's ?- ENTERIC MUCOSA SHOWING SEVERE ACTIVE AND CHRONIC NONSPECIFIC INFLAMMATION WITH ULCERATION, CONSISTENT WITH ANASTOMOTIC SITE ?- NEGATIVE FOR GRANULOMAS OR DYSPLASIA ? ?Past Medical History:  ?Diagnosis Date  ? ALLERGIC RHINITIS 03/18/2007  ? ANEMIA-IRON DEFICIENCY 03/18/2007  ? hx of  ? Anxiety   ? work related stress-  ? CROHN'S DISEASE 01/04/2007  ? DERMATITIS, SCALP 12/09/2007  ? GERD (gastroesophageal reflux disease)   ? OTC meds PRN  ? HYPERTENSION 01/04/2007  ? on meds  ? Incisional hernia 06/18/2010  ? Seasonal allergies   ? Sleep apnea   ? uses CPAP  ? URI 12/09/2007  ? ? ? ?Current Medications, Allergies, Family History and Social History were reviewed in [employer] record. ?  ?  ?Current Outpatient Medications  ?Medication Sig Dispense Refill  ? amLODipine-olmesartan (AZOR) 5-40 MG tablet Take 1 tablet by mouth daily. 90 tablet 3  ? Ascorbic Acid (VITAMIN C PO) Take 1 tablet by mouth daily at 6 (six) AM.    ? fexofenadine (ALLEGRA) 60 MG tablet     ? fluticasone (FLONASE) 50 MCG/ACT nasal spray Place 2 sprays into both  nostrils daily. 16 g 0  ? hydrochlorothiazide (HYDRODIURIL) 25 MG tablet Take 0.5 tablets (12.5 mg total) by mouth daily. 45 tablet 3  ? ?No current facility-administered medications for this visit.  ? ? ?Review of Systems: ?No chest pain. No shortness of breath. No urinary complaints.  ? ? ?Physical Exam  ? ?Wt Readings from Last 3 Encounters:  ?06/16/21 190 lb 6.4 oz (86.4 kg)  ?05/09/21 180 lb (81.6 kg)  ?04/11/21 180 lb (81.6 kg)  ? ? ?Ht _2  (1.626 m)   Wt 190 lb 6.4 oz (86.4 kg)   BMI 32.68 kg/m?  ?Constitutional:  Generally well appearing  male in no acute distress. ?Psychiatric: Pleasant. Normal mood and affect. Behavior is normal. ?EENT: Pupils normal.  Conjunctivae are normal. No scleral icterus. ?Neck supple.  ?Cardiovascular: Normal rate, regular rhythm. No edema ?Pulmonary/chest: Effort normal and breath sounds normal. No wheezing, rales or rhonchi. ?Abdominal: Soft, nondistended, nontender. Bowel sounds active throughout. There are no masses palpable. No hepatomegaly. ?Neurological: Alert and oriented to person place and time. ?Skin: Skin is warm and dry. No rashes noted. ? ?Tye Savoy, NP  06/16/2021, 8:47 AM ? ? ? ? ? ? ? ? ? ?

## 2021-06-17 LAB — IRON AND TIBC
Iron Saturation: 29 % (ref 15–55)
Iron: 82 ug/dL (ref 38–169)
Total Iron Binding Capacity: 282 ug/dL (ref 250–450)
UIBC: 200 ug/dL (ref 111–343)

## 2021-06-18 NOTE — Progress Notes (Signed)
Agree with assessment/plan. ?Humira denied by insurance company ?Patient is asymptomatic and would like to hold off on treatment at this time ?He has follow-up visit in GI clinic.  We will discuss more. ? ?RG ?

## 2021-06-20 ENCOUNTER — Telehealth: Payer: Self-pay | Admitting: Hematology and Oncology

## 2021-06-20 ENCOUNTER — Telehealth: Payer: Self-pay

## 2021-06-20 ENCOUNTER — Other Ambulatory Visit: Payer: Self-pay

## 2021-06-20 DIAGNOSIS — D649 Anemia, unspecified: Secondary | ICD-10-CM

## 2021-06-20 NOTE — Telephone Encounter (Signed)
Scheduled appointment per 04/14 referral. Patient is aware of appointment date and time. Patient is aware to arrive 15 mins prior to appointment time and to bring updated insurance cards. Patient is aware of location.   ?

## 2021-06-20 NOTE — Telephone Encounter (Signed)
-----   Message from Willia Craze, NP sent at 06/19/2021  8:22 PM EDT ----- ?Mickel Baas, please let patient know I sent him a message. We will hold off on crohn's treatment until his appt with Dr. Lyndel Safe. Let him know he is anemic but not iron deficient. Please refer to Hematology for evaluation of anemia. Thanks ? ?

## 2021-06-20 NOTE — Telephone Encounter (Signed)
Spoke with pt. Gave pt message from [place]. Pt stated he also saw message that was sent by Nevin Bloodgood. Pt verbalized understanding of results and recommendations. Referral to hematology placed. Pt had no questions at this time.  ?

## 2021-07-03 ENCOUNTER — Inpatient Hospital Stay
Payer: No Typology Code available for payment source | Attending: Hematology and Oncology | Admitting: Hematology and Oncology

## 2021-07-03 ENCOUNTER — Other Ambulatory Visit: Payer: Self-pay

## 2021-07-03 ENCOUNTER — Inpatient Hospital Stay: Payer: No Typology Code available for payment source

## 2021-07-03 VITALS — BP 141/90 | HR 66 | Temp 97.2°F | Resp 18 | Ht 64.0 in | Wt 188.7 lb

## 2021-07-03 DIAGNOSIS — D509 Iron deficiency anemia, unspecified: Secondary | ICD-10-CM | POA: Insufficient documentation

## 2021-07-03 DIAGNOSIS — Z79899 Other long term (current) drug therapy: Secondary | ICD-10-CM | POA: Insufficient documentation

## 2021-07-03 NOTE — Progress Notes (Signed)
[facility] ?CONSULT NOTE ? ?Patient Care Team: ?Biagio Borg, MD as PCP - General ? ?CHIEF COMPLAINTS/PURPOSE OF CONSULTATION:  ?Microcytosis ? ?HISTORY OF PRESENTING ILLNESS:  ?Bradley Donaldson 51 y.o. male is here because of recent diagnosis of microcytosis.  Patient has had longstanding history of microcytosis without any anemia.  Iron studies were normal.  He was referred to [place] to evaluate for thalassemia. ?He has had no major symptoms or concerns.  He works as a Financial planner. ? ?I reviewed her records extensively and collaborated the history with the patient. ? ? ?MEDICAL HISTORY:  ?Past Medical History:  ?Diagnosis Date  ? ALLERGIC RHINITIS 03/18/2007  ? ANEMIA-IRON DEFICIENCY 03/18/2007  ? hx of  ? Anxiety   ? work related stress-  ? CROHN'S DISEASE 01/04/2007  ? DERMATITIS, SCALP 12/09/2007  ? GERD (gastroesophageal reflux disease)   ? OTC meds PRN  ? HYPERTENSION 01/04/2007  ? on meds  ? Incisional hernia 06/18/2010  ? Seasonal allergies   ? Sleep apnea   ? uses CPAP  ? URI 12/09/2007  ? ? ?SURGICAL HISTORY: ?Past Surgical History:  ?Procedure Laterality Date  ? COLONOSCOPY  2019  ? RG-prep exc  ? partial colon removal  1998  ? Right hemicolectomy  ? ? ?SOCIAL HISTORY: ?Social History  ? ?Socioeconomic History  ? Marital status: Married  ?  Spouse name: Not on file  ? Number of children: 2  ? Years of education: Not on file  ? Highest education level: Not on file  ?Occupational History  ? Occupation: works at home  ?Tobacco Use  ? Smoking status: Never  ? Smokeless tobacco: Never  ?Vaping Use  ? Vaping Use: Never used  ?Substance and Sexual Activity  ? Alcohol use: Not Currently  ?  Alcohol/week: 0.0 - 1.0 standard drinks  ? Drug use: No  ? Sexual activity: Not on file  ?Other Topics Concern  ? Not on file  ?Social History Narrative  ? Not on file  ? ?Social Determinants of Health  ? ?Financial Resource Strain: Not on file  ?Food Insecurity: Not on file  ?Transportation Needs: Not on file   ?Physical Activity: Not on file  ?Stress: Not on file  ?Social Connections: Not on file  ?Intimate Partner Violence: Not on file  ? ? ?FAMILY HISTORY: ?Family History  ?Problem Relation Age of Onset  ? Heart disease Father   ? Hypertension Other   ? Colon cancer Neg Hx   ? Esophageal cancer Neg Hx   ? Rectal cancer Neg Hx   ? Stomach cancer Neg Hx   ? Colon polyps Neg Hx   ? ? ?ALLERGIES:  is allergic to ranitidine hcl. ? ?MEDICATIONS:  ?Current Outpatient Medications  ?Medication Sig Dispense Refill  ? amLODipine-olmesartan (AZOR) 5-40 MG tablet Take 1 tablet by mouth daily. 90 tablet 3  ? Ascorbic Acid (VITAMIN C PO) Take 1 tablet by mouth daily at 6 (six) AM.    ? fexofenadine (ALLEGRA) 60 MG tablet     ? Fluocinolone Acetonide Scalp 0.01 % OIL SMARTSIG:sparingly Topical    ? fluticasone (FLONASE) 50 MCG/ACT nasal spray Place into the nose.    ? hydrochlorothiazide (HYDRODIURIL) 25 MG tablet Take 0.5 tablets (12.5 mg total) by mouth daily. 45 tablet 3  ? triamcinolone (KENALOG) 0.025 % ointment Apply topically.    ? ?No current facility-administered medications for this visit.  ? ? ?REVIEW OF SYSTEMS:   ?  ?All other systems were reviewed  with the patient and are negative. ? ?PHYSICAL EXAMINATION: ?ECOG PERFORMANCE STATUS: 1 - Symptomatic but completely ambulatory ? ?Vitals:  ? 07/03/21 1548  ?BP: (!) 141/90  ?Pulse: 66  ?Resp: 18  ?Temp: (!) 97.2 ?F (36.2 ?C)  ?SpO2: 99%  ? ?Filed Weights  ? 07/03/21 1548  ?Weight: 188 lb 11.2 oz (85.6 kg)  ? ?  ? ?LABORATORY DATA:  ?I have reviewed the data as listed ?Lab Results  ?Component Value Date  ? WBC 10.2 05/09/2021  ? HGB 11.5 (L) 05/09/2021  ? HCT 36.3 (L) 05/09/2021  ? MCV 68.7 Repeated and verified X2. (L) 05/09/2021  ? PLT 412.0 (H) 05/09/2021  ? ?Lab Results  ?Component Value Date  ? NA 139 05/09/2021  ? K 3.8 05/09/2021  ? CL 103 05/09/2021  ? CO2 27 05/09/2021  ? ? ?RADIOGRAPHIC STUDIES: ?I have personally reviewed the radiological reports and agreed with the  findings in the report. ? ?ASSESSMENT AND PLAN:  ?Microcytic anemia ?Microcytic anemia: ?Lab review: ?2019: Hemoglobin 12.7, MCV 68.7 ?2022: Hemoglobin 13.1, MCV 67.3 ?05/09/2021: Hemoglobin 11.5, MCV 68.7, RDW 16.1, platelets 412, WBC 10.2, iron saturation 29%, ferritin 93 ? ?Microcytic anemia without iron deficiency suggestive of thalassemia. ?Recommend hemoglobin electrophoresis ? ?Counseling: I discussed with the patient extensively about the pathophysiology of thalassemia's. ? ?Follow-up in 1 week to discuss results ? ?All questions were answered. The patient knows to call the clinic with any problems, questions or concerns. ?  ? Harriette Ohara, MD ?07/03/21 ? ?

## 2021-07-03 NOTE — Assessment & Plan Note (Signed)
Microcytic anemia: ?Lab review: ?2019: Hemoglobin 12.7, MCV 68.7 ?2022: Hemoglobin 13.1, MCV 67.3 ?05/09/2021: Hemoglobin 11.5, MCV 68.7, RDW 16.1, platelets 412, WBC 10.2, iron saturation 29%, ferritin 93 ? ?Microcytic anemia without iron deficiency suggestive of thalassemia. ?Recommend hemoglobin electrophoresis ?Follow-up in 1 week to discuss results ?

## 2021-07-04 ENCOUNTER — Telehealth: Payer: Self-pay | Admitting: Hematology and Oncology

## 2021-07-04 NOTE — Telephone Encounter (Signed)
Per 4/27 los called and spoke to pt about appointments pt confirmed appointment  ?

## 2021-07-07 LAB — HGB FRACTIONATION CASCADE
Hgb A2: 2.2 % (ref 1.8–3.2)
Hgb A: 97.8 % (ref 96.4–98.8)
Hgb F: 0 % (ref 0.0–2.0)
Hgb S: 0 %

## 2021-07-15 ENCOUNTER — Inpatient Hospital Stay: Payer: No Typology Code available for payment source

## 2021-07-15 ENCOUNTER — Other Ambulatory Visit: Payer: Self-pay

## 2021-07-15 ENCOUNTER — Inpatient Hospital Stay
Payer: No Typology Code available for payment source | Attending: Hematology and Oncology | Admitting: Hematology and Oncology

## 2021-07-15 DIAGNOSIS — D509 Iron deficiency anemia, unspecified: Secondary | ICD-10-CM | POA: Insufficient documentation

## 2021-07-15 NOTE — Progress Notes (Signed)
HEMATOLOGY-ONCOLOGY TELEPHONE VISIT PROGRESS NOTE ? ?I connected with Mr.Heese on 07/15/21 at  8:15 AM EDT by telephone and verified that I am speaking with the correct person using two identifiers.  ?I discussed the limitations, risks, security and privacy concerns of performing an evaluation and management service by telephone and the availability of in person appointments.  ?I also discussed with the patient that there may be a patient responsible charge related to this service. The patient expressed understanding and agreed to proceed.  ? ?History of Present Illness: Follow-up to discuss results of blood work ?He reports no new problems or concerns ? ?REVIEW OF SYSTEMS:   ?Constitutional: Denies fevers, chills or abnormal weight loss  ?All other systems were reviewed with the patient and are negative. ? ?Observations/Objective:  ?  ?Assessment Plan:  ?Microcytic anemia ?Microcytic anemia: ?Lab review: ?2019: Hemoglobin 12.7, MCV 68.7 ?2022: Hemoglobin 13.1, MCV 67.3 ?05/09/2021: Hemoglobin 11.5, MCV 68.7, RDW 16.1, platelets 412, WBC 10.2, iron saturation 29%, ferritin 93 ?07/03/2021: Hemoglobin electrophoresis: Normal adult hemoglobin, iron saturation 29%, ferritin 93 ? ?I suspect that the patient may have alpha thalassemia.  Therefore we will request for alpha thalassemia gene analysis ? ?I discussed the assessment and treatment plan with the patient. The patient was provided an opportunity to ask questions and all were answered. The patient agreed with the plan and demonstrated an understanding of the instructions. The patient was advised to call back or seek an in-person evaluation if the symptoms worsen or if the condition fails to improve as anticipated.  ? ?I provided 11 minutes of non-face-to-face time during this encounter. Harriette Ohara, MD  ? ?

## 2021-07-15 NOTE — Assessment & Plan Note (Signed)
Microcytic anemia: ?Lab review: ?2019: Hemoglobin 12.7, MCV 68.7 ?2022: Hemoglobin 13.1, MCV 67.3 ?05/09/2021: Hemoglobin 11.5, MCV 68.7, RDW 16.1, platelets 412, WBC 10.2, iron saturation 29%, ferritin 93 ?07/03/2021: Hemoglobin electrophoresis: Normal adult hemoglobin, iron saturation 29%, ferritin 93 ? ?I suspect that the patient may have alpha thalassemia.  Therefore we will request for alpha thalassemia gene analysis ? ?

## 2021-07-16 ENCOUNTER — Telehealth: Payer: Self-pay | Admitting: Hematology and Oncology

## 2021-07-16 NOTE — Progress Notes (Signed)
07/17/21- 33 yoM never smoker for sleep evaluation with concern of OSA ?Medical problem list includes OSA, HTN, Allergic Rhinitis, GERD, Goiter, Rash, Anxiety, Regional Enteritis,  ?HST 2012- AHI 25/ hr ?Epworth score-9 ?Body weight today-190 ?Covid vax-2 Phizer, 1 Moderna ?Flu vax-had ?CPAP/ [employer] ?Life has been better with CPAP but in the last year he is noticing more daytime tiredness.  CPAP still prevents snoring.  No ENT surgery heart or lung disease recognized.  Takes no sleep medicines and little caffeine.  Works from home with no nighttime work or call in.  He has continued using his old CPAP machine every night. ? ?Prior to Admission medications   ?Medication Sig Start Date End Date Taking? Authorizing Provider  ?amLODipine-olmesartan (AZOR) 5-40 MG tablet Take 1 tablet by mouth daily. 06/14/20  Yes Biagio Borg, MD  ?Ascorbic Acid (VITAMIN C PO) Take 1 tablet by mouth daily at 6 (six) AM.   Yes [provider]  ?fexofenadine (ALLEGRA) 60 MG tablet  04/28/21  Yes [provider]  ?Fluocinolone Acetonide Scalp 0.01 % OIL SMARTSIG:sparingly Topical 05/19/21  Yes [provider]  ?fluticasone (FLONASE) 50 MCG/ACT nasal spray Place into the nose. 05/01/21  Yes [provider]  ?hydrochlorothiazide (HYDRODIURIL) 25 MG tablet Take 0.5 tablets (12.5 mg total) by mouth daily. 06/14/20  Yes Biagio Borg, MD  ?triamcinolone (KENALOG) 0.025 % ointment Apply topically. 05/19/21  Yes [provider]  ? ?Past Medical History:  ?Diagnosis Date  ? ALLERGIC RHINITIS 03/18/2007  ? ANEMIA-IRON DEFICIENCY 03/18/2007  ? hx of  ? Anxiety   ? work related stress-  ? CROHN'S DISEASE 01/04/2007  ? DERMATITIS, SCALP 12/09/2007  ? GERD (gastroesophageal reflux disease)   ? OTC meds PRN  ? HYPERTENSION 01/04/2007  ? on meds  ? Incisional hernia 06/18/2010  ? Seasonal allergies   ? Sleep apnea   ? uses CPAP  ? URI 12/09/2007  ? ?Past Surgical History:  ?Procedure Laterality Date  ? COLONOSCOPY   2019  ? RG-prep exc  ? partial colon removal  1998  ? Right hemicolectomy  ? ?Family History  ?Problem Relation Age of Onset  ? Heart disease Father   ? Hypertension Other   ? Colon cancer Neg Hx   ? Esophageal cancer Neg Hx   ? Rectal cancer Neg Hx   ? Stomach cancer Neg Hx   ? Colon polyps Neg Hx   ? ?Social History  ? ?Socioeconomic History  ? Marital status: Married  ?  Spouse name: Not on file  ? Number of children: 2  ? Years of education: Not on file  ? Highest education level: Not on file  ?Occupational History  ? Occupation: works at home  ?Tobacco Use  ? Smoking status: Never  ? Smokeless tobacco: Never  ?Vaping Use  ? Vaping Use: Never used  ?Substance and Sexual Activity  ? Alcohol use: Not Currently  ?  Alcohol/week: 0.0 - 1.0 standard drinks  ? Drug use: No  ? Sexual activity: Not on file  ?Other Topics Concern  ? Not on file  ?Social History Narrative  ? Not on file  ? ?Social Determinants of Health  ? ?Financial Resource Strain: Not on file  ?Food Insecurity: Not on file  ?Transportation Needs: Not on file  ?Physical Activity: Not on file  ?Stress: Not on file  ?Social Connections: Not on file  ?Intimate Partner Violence: Not on file  ? ?ROS-see HPI   + = positive ?Constitutional:  weight loss, night sweats, fevers, chills, fatigue, lassitude. ?HEENT:    headaches, difficulty swallowing, tooth/dental problems, sore throat,  ?     sneezing, itching, ear ache, nasal congestion, post nasal drip, snoring ?CV:    chest pain, orthopnea, PND, swelling in lower extremities, anasarca,                                  dizziness, palpitations ?Resp:   shortness of breath with exertion or at rest.   ?             productive cough,   non-productive cough, coughing up of blood.   ?           change in color of mucus.  wheezing.   ?Skin:    rash or lesions. ?GI:  No-   heartburn, indigestion, abdominal pain, nausea, vomiting, diarrhea,  ?               change in bowel habits, loss of appetite ?GU: dysuria, change  in color of urine, no urgency or frequency.   flank pain. ?MS:   joint pain, stiffness, decreased range of motion, back pain. ?Neuro-     nothing unusual ?Psych:  change in mood or affect.  depression or anxiety.   memory loss. ? ? ?OBJ- Physical Exam ?General- Alert, Oriented, Affect-appropriate, Distress- none acute, +obese ?Skin- rash-none, lesions- none, excoriation- none ?Lymphadenopathy- none ?Head- atraumatic ?           Eyes- Gross vision intact, PERRLA, conjunctivae and secretions clear ?           Ears- Hearing, canals-normal ?           Nose- Clear, no-Septal dev, mucus, polyps, erosion, perforation  ?           Throat- Mallampati IV , mucosa clear , drainage- none, tonsils- atrophic, +teeth ?Neck- flexible , trachea midline, no stridor , thyroid nl, carotid no bruit ?Chest - symmetrical excursion , unlabored ?          Heart/CV- RRR , no murmur , no gallop  , no rub, nl s1 s2 ?                          - JVD- none , edema- none, stasis changes- none, varices- none ?          Lung- clear to P&A, wheeze- none, cough- none , dullness-none, rub- none ?          Chest wall-  ?Abd-  ?Br/ Gen/ Rectal- Not done, not indicated ?Extrem- cyanosis- none, clubbing, none, atrophy- none, strength- nl ?Neuro- grossly intact to observation ? ?

## 2021-07-16 NOTE — Telephone Encounter (Signed)
Scheduled appointment per 5/9 los. Left message. ?

## 2021-07-17 ENCOUNTER — Encounter: Payer: Self-pay | Admitting: Internal Medicine

## 2021-07-17 ENCOUNTER — Ambulatory Visit (INDEPENDENT_AMBULATORY_CARE_PROVIDER_SITE_OTHER): Payer: No Typology Code available for payment source | Admitting: Internal Medicine

## 2021-07-17 VITALS — BP 122/72 | HR 82 | Temp 98.2°F | Ht 64.0 in | Wt 190.6 lb

## 2021-07-17 DIAGNOSIS — I10 Essential (primary) hypertension: Secondary | ICD-10-CM

## 2021-07-17 DIAGNOSIS — G4733 Obstructive sleep apnea (adult) (pediatric): Secondary | ICD-10-CM

## 2021-07-17 NOTE — Assessment & Plan Note (Signed)
He has continued to benefit from CPAP with long-term compliance.  Machine is old. ?Plan-replace old CPAP machine auto 4-20, mask of choice, supplies, humidifier, Airview/card ?

## 2021-07-17 NOTE — Patient Instructions (Signed)
Order- DME [facility]   please replace old CPAP machine auto 5-20, mask of choice, humidifier supplies, AirView/ card ? ?Please call if we can help ?

## 2021-07-17 NOTE — Assessment & Plan Note (Signed)
Understands association of poor blood pressure control with inadequate sleep apnea control.  Seems to be doing well at this time. ?

## 2021-07-22 ENCOUNTER — Inpatient Hospital Stay: Payer: No Typology Code available for payment source | Admitting: Hematology and Oncology

## 2021-07-22 NOTE — Assessment & Plan Note (Deleted)
Microcytic anemia: Lab review: 2019: Hemoglobin 12.7, MCV 68.7 2022: Hemoglobin 13.1, MCV 67.3 05/09/2021: Hemoglobin 11.5, MCV 68.7, RDW 16.1, platelets 412, WBC 10.2, iron saturation 29%, ferritin 93 07/03/2021: Hemoglobin electrophoresis: Normal adult hemoglobin, iron saturation 29%, ferritin 93 07/15/2021: Alpha thalassemia gene analysis: Pending

## 2021-07-24 LAB — ALPHA-THALASSEMIA GENOTYPR

## 2021-07-25 ENCOUNTER — Other Ambulatory Visit: Payer: Self-pay | Admitting: Internal Medicine

## 2021-07-25 NOTE — Telephone Encounter (Signed)
Please refill as per office routine med refill policy (all routine meds to be refilled for 3 mo or monthly (per pt preference) up to one year from last visit, then month to month grace period for 3 mo, then further med refills will have to be denied) ? ?

## 2021-07-29 ENCOUNTER — Ambulatory Visit (INDEPENDENT_AMBULATORY_CARE_PROVIDER_SITE_OTHER): Payer: No Typology Code available for payment source | Admitting: Gastroenterology

## 2021-07-29 ENCOUNTER — Other Ambulatory Visit (INDEPENDENT_AMBULATORY_CARE_PROVIDER_SITE_OTHER): Payer: No Typology Code available for payment source

## 2021-07-29 ENCOUNTER — Encounter: Payer: Self-pay | Admitting: Gastroenterology

## 2021-07-29 ENCOUNTER — Other Ambulatory Visit: Payer: Self-pay | Admitting: Internal Medicine

## 2021-07-29 VITALS — BP 130/86 | HR 75 | Ht 64.0 in | Wt 190.0 lb

## 2021-07-29 DIAGNOSIS — K219 Gastro-esophageal reflux disease without esophagitis: Secondary | ICD-10-CM

## 2021-07-29 DIAGNOSIS — E538 Deficiency of other specified B group vitamins: Secondary | ICD-10-CM

## 2021-07-29 DIAGNOSIS — D509 Iron deficiency anemia, unspecified: Secondary | ICD-10-CM | POA: Diagnosis not present

## 2021-07-29 DIAGNOSIS — K50019 Crohn's disease of small intestine with unspecified complications: Secondary | ICD-10-CM

## 2021-07-29 DIAGNOSIS — R718 Other abnormality of red blood cells: Secondary | ICD-10-CM

## 2021-07-29 DIAGNOSIS — K50919 Crohn's disease, unspecified, with unspecified complications: Secondary | ICD-10-CM

## 2021-07-29 LAB — COMPREHENSIVE METABOLIC PANEL
ALT: 20 U/L (ref 0–53)
AST: 16 U/L (ref 0–37)
Albumin: 4.4 g/dL (ref 3.5–5.2)
Alkaline Phosphatase: 63 U/L (ref 39–117)
BUN: 10 mg/dL (ref 6–23)
CO2: 30 mEq/L (ref 19–32)
Calcium: 9.7 mg/dL (ref 8.4–10.5)
Chloride: 102 mEq/L (ref 96–112)
Creatinine, Ser: 1.3 mg/dL (ref 0.40–1.50)
GFR: 63.71 mL/min (ref >60.00)
Glucose, Bld: 105 mg/dL — ABNORMAL HIGH (ref 70–99)
Potassium: 3.7 mEq/L (ref 3.5–5.1)
Sodium: 140 mEq/L (ref 135–145)
Total Bilirubin: 0.4 mg/dL (ref 0.2–1.2)
Total Protein: 8.3 g/dL (ref 6.0–8.3)

## 2021-07-29 LAB — CBC WITH DIFFERENTIAL/PLATELET
Basophils Absolute: 0 10*3/uL (ref 0.0–0.1)
Basophils Relative: 0.6 % (ref 0.0–3.0)
Eosinophils Absolute: 0.2 10*3/uL (ref 0.0–0.7)
Eosinophils Relative: 2.3 % (ref 0.0–5.0)
HCT: 38.4 % — ABNORMAL LOW (ref 39.0–52.0)
Hemoglobin: 12.2 g/dL — ABNORMAL LOW (ref 13.0–17.0)
Lymphocytes Relative: 25.3 % (ref 12.0–46.0)
Lymphs Abs: 2.1 10*3/uL (ref 0.7–4.0)
MCHC: 31.9 g/dL (ref 30.0–36.0)
MCV: 68.7 fl — ABNORMAL LOW (ref 78.0–100.0)
Monocytes Absolute: 0.7 10*3/uL (ref 0.1–1.0)
Monocytes Relative: 8.2 % (ref 3.0–12.0)
Neutro Abs: 5.3 10*3/uL (ref 1.4–7.7)
Neutrophils Relative %: 63.6 % (ref 43.0–77.0)
Platelets: 367 10*3/uL (ref 150.0–400.0)
RBC: 5.59 Mil/uL (ref 4.22–5.81)
RDW: 16.5 % — ABNORMAL HIGH (ref 11.5–15.5)
WBC: 8.3 10*3/uL (ref 4.0–10.5)

## 2021-07-29 LAB — C-REACTIVE PROTEIN: CRP: 3.4 mg/dL (ref 0.5–20.0)

## 2021-07-29 LAB — VITAMIN B12: Vitamin B-12: 186 pg/mL — ABNORMAL LOW (ref 211–911)

## 2021-07-29 MED ORDER — OMEPRAZOLE 20 MG PO CPDR: 20.0000 mg | DELAYED_RELEASE_CAPSULE | Freq: Every day | ORAL | 3 refills | Status: DC

## 2021-07-29 NOTE — Patient Instructions (Signed)
If you are age 51 or older, your body mass index should be between 23-30. Your Body mass index is 32.61 kg/m. If this is out of the aforementioned range listed, please consider follow up with your Primary Care Provider.  If you are age 51 or younger, your body mass index should be between 19-25. Your Body mass index is 32.61 kg/m. If this is out of the aformentioned range listed, please consider follow up with your Primary Care Provider.   ________________________________________________________  The [facility] providers would like to encourage you to use [facility] to communicate with providers for non-urgent requests or questions.  Due to long hold times on the telephone, sending your provider a message by [facility] may be a faster and more efficient way to get a response.  Please allow 48 business hours for a response.  Please remember that this is for non-urgent requests.  _______________________________________________________  Your provider has requested that you go to the basement level for lab work before leaving today. Press "B" on the elevator. The lab is located at the first door on the left as you exit the elevator.  Please call in 6 months to schedule an office visit  We have sent the following medications to your pharmacy for you to pick up at your convenience: Omeprazole daily on demand  You have been scheduled for a CT scan of the abdomen and pelvis at [facility] are scheduled on Tuesday June 6th at 930am. You should arrive 90 minutes prior to your appointment time for registration. Please follow the written instructions below on the day of your exam:  WARNING: IF YOU ARE ALLERGIC TO IODINE/X-RAY DYE, PLEASE NOTIFY RADIOLOGY IMMEDIATELY AT [phone]! YOU WILL BE GIVEN A 13 HOUR PREMEDICATION PREP.  1) Do not eat or drink anything after 530am (4 hours prior to your test)   You may take any medications as prescribed with a small amount of water, if necessary.  If you take any of the following medications: METFORMIN, GLUCOPHAGE, GLUCOVANCE, AVANDAMET, RIOMET, FORTAMET, [place] MET, JANUMET, GLUMETZA or METAGLIP, you MAY be asked to HOLD this medication 48 hours AFTER the exam.  The purpose of you drinking the oral contrast is to aid in the visualization of your intestinal tract. The contrast solution may cause some diarrhea. Depending on your individual set of symptoms, you may also receive an intravenous injection of x-ray contrast/dye. Plan on being at [facility] for 30 minutes or longer, depending on the type of exam you are having performed.  This test typically takes 30-45 minutes to complete.  If you have any questions regarding your exam or if you need to reschedule, you may call the CT department at [phone] between the hours of 8:00 am and 5:00 pm, Monday-Friday.  ________________________________________________________________________  Thank you,  Dr. Jackquline Denmark

## 2021-07-29 NOTE — Progress Notes (Signed)
Chief Complaint: FU  Referring Provider:  Biagio Borg, MD      ASSESSMENT AND PLAN;   Crohn's ileitis s/p remote resection. Reoccurrence of disease at anastomosis March 2023. Biopsies compatible with chronic active ileitis. No Crohn's treatment in 30 years. Humira recently denied by [employer]. Clinically he is doing well without treatment    Microcytic anemia d/t alpha thalassemia.     H/O B12 deficiency ( ileal resection).  Currently B12 is in low normal range at 228.   4.   GERD   Plan:  -CBC, CMP, CRP, B12 -If low b12, start B12 '1mg'$  IM Qmonthly -CTE -Omeprazole '20mg'$  po QD on demand. -Stool studies for GI Pathogen (includes C. Diff), elastase, and Calprotectin. -If above is suggestive of active Crohn's, would again try for Humira or mesalamine.  Discussed in detail both options. -FU in 24 weeks.    HPI:    Bradley Donaldson is a 51 y.o. male  With OSA, HTN, allergic rhinitis, GERD, goiter, anxiety, longstanding Crohn's disease  For follow-up visit For details see Paula's recent note.  Overall has been doing well from GI standpoint.  Has diarrhea 1/week without melena/hematochezia.  No nocturnal symptoms.  Some abdominal bloating but no abdo pain.  Denies having any N/V/weight loss/skin rash or joint pains.  Has been having heartburn 1/week without odynophagia or dysphagia.  Recent colonoscopy 05/09/2021 showed recurrence of Crohn's at ileocolic anastomosis. Bx-" severe active inflammation".  There were no strictures.  He had normal CRP, CBC. Neg TB Gold/HbsAg.  Unfortunately, insurance denied Humira.  He does give H/O S/O lactose intolerance.  No weight loss as below  Wt Readings from Last 3 Encounters:  07/29/21 190 lb (86.2 kg)  07/17/21 190 lb 9.6 oz (86.5 kg)  07/03/21 188 lb 11.2 oz (85.6 kg)    Past GI procedures: Colon 05/09/2021 - Ileo-colonic anastomosis, characterized by erosion, inflammation and ulceration s/o anastomotic recurrence.  Biopsied. - No perianal Crohn's disease. - The examined portion of the ileum was normal. - The examination was otherwise normal on direct and retroflexion views.  Past Medical History:  Diagnosis Date   ALLERGIC RHINITIS 03/18/2007   ANEMIA-IRON DEFICIENCY 03/18/2007   hx of   Anxiety    work related stress-   CROHN'S DISEASE 01/04/2007   DERMATITIS, SCALP 12/09/2007   GERD (gastroesophageal reflux disease)    OTC meds PRN   HYPERTENSION 01/04/2007   on meds   Incisional hernia 06/18/2010   Seasonal allergies    Sleep apnea    uses CPAP   URI 12/09/2007    Past Surgical History:  Procedure Laterality Date   COLONOSCOPY  2019   RG-prep exc   partial colon removal  1998   Right hemicolectomy    Family History  Problem Relation Age of Onset   Heart disease Father    Hypertension Other    Colon cancer Neg Hx    Esophageal cancer Neg Hx    Rectal cancer Neg Hx    Stomach cancer Neg Hx    Colon polyps Neg Hx     Social History   Tobacco Use   Smoking status: Never   Smokeless tobacco: Never  Vaping Use   Vaping Use: Never used  Substance Use Topics   Alcohol use: Not Currently    Alcohol/week: 0.0 - 1.0 standard drinks   Drug use: No    Current Outpatient Medications  Medication Sig Dispense Refill   amLODipine-olmesartan (AZOR) 5-40 MG tablet Take  1 tablet by mouth daily. 90 tablet 3   Ascorbic Acid (VITAMIN C PO) Take 1 tablet by mouth daily at 6 (six) AM.     fexofenadine (ALLEGRA) 60 MG tablet      Fluocinolone Acetonide Scalp 0.01 % OIL SMARTSIG:sparingly Topical     fluticasone (FLONASE) 50 MCG/ACT nasal spray Place into the nose.     hydrochlorothiazide (HYDRODIURIL) 25 MG tablet Take 0.5 tablets (12.5 mg total) by mouth daily. 45 tablet 3   triamcinolone (KENALOG) 0.025 % ointment Apply topically.     No current facility-administered medications for this visit.    Allergies  Allergen Reactions   Ranitidine Hcl     Review of Systems:   neg     Physical Exam:    BP 130/86   Pulse 75   Ht '5\' 4"'$  (1.626 m)   Wt 190 lb (86.2 kg)   SpO2 99%   BMI 32.61 kg/m  Wt Readings from Last 3 Encounters:  07/29/21 190 lb (86.2 kg)  07/17/21 190 lb 9.6 oz (86.5 kg)  07/03/21 188 lb 11.2 oz (85.6 kg)   Constitutional:  Well-developed, in no acute distress. Psychiatric: Normal mood and affect. Behavior is normal. HEENT: Pupils normal.  Conjunctivae are normal. No scleral icterus. Neck supple.  Cardiovascular: Normal rate, regular rhythm. No edema Pulmonary/chest: Effort normal and breath sounds normal. No wheezing, rales or rhonchi. Abdominal: Soft, nondistended. Nontender. Bowel sounds active throughout. There are no masses palpable. No hepatomegaly.  Well-healed surgical scar. Rectal: Deferred Neurological: Alert and oriented to person place and time. Skin: Skin is warm and dry. No rashes noted.  Data Reviewed: I have personally reviewed following labs and imaging studies  CBC:    Latest Ref Rng & Units 05/09/2021   12:18 PM 06/07/2020   12:00 PM 05/24/2019    4:40 PM  CBC  WBC 4.0 - 10.5 K/uL 10.2   9.6   10.1    Hemoglobin 13.0 - 17.0 g/dL 11.5   13.1   12.4    Hematocrit 39.0 - 52.0 % 36.3   41.1   38.7    Platelets 150.0 - 400.0 K/uL 412.0   340.0   344.0      CMP:    Latest Ref Rng & Units 05/09/2021   12:18 PM 06/07/2020   12:00 PM 05/24/2019    4:40 PM  CMP  Glucose 70 - 99 mg/dL 85   86   94    BUN 6 - 23 mg/dL '13   15   15    '$ Creatinine 0.40 - 1.50 mg/dL 1.31   1.35   1.35    Sodium 135 - 145 mEq/L 139   138   140    Potassium 3.5 - 5.1 mEq/L 3.8   4.3   3.7    Chloride 96 - 112 mEq/L 103   99   101    CO2 19 - 32 mEq/L 27   27   32    Calcium 8.4 - 10.5 mg/dL 8.9   10.0   9.5    Total Protein 6.0 - 8.3 g/dL 7.8   8.2   7.8    Total Bilirubin 0.2 - 1.2 mg/dL 0.7   0.6   0.5    Alkaline Phos 39 - 117 U/L 58   61   59    AST 0 - 37 U/L '15   16   16    '$ ALT 0 - 53 U/L 17  [place], MD 07/29/2021, 8:38 AM  Cc: Biagio Borg, MD

## 2021-07-30 ENCOUNTER — Other Ambulatory Visit: Payer: Self-pay

## 2021-07-30 ENCOUNTER — Other Ambulatory Visit: Payer: No Typology Code available for payment source

## 2021-07-30 DIAGNOSIS — D509 Iron deficiency anemia, unspecified: Secondary | ICD-10-CM

## 2021-07-30 DIAGNOSIS — E538 Deficiency of other specified B group vitamins: Secondary | ICD-10-CM

## 2021-07-30 DIAGNOSIS — K50019 Crohn's disease of small intestine with unspecified complications: Secondary | ICD-10-CM

## 2021-07-30 DIAGNOSIS — K219 Gastro-esophageal reflux disease without esophagitis: Secondary | ICD-10-CM

## 2021-07-30 MED ORDER — CYANOCOBALAMIN 1000 MCG/ML IJ SOLN: 1000.0000 ug | INTRAMUSCULAR | 11 refills | Status: DC

## 2021-08-01 LAB — GI PROFILE, STOOL, PCR

## 2021-08-02 LAB — QUANTIFERON-TB GOLD PLUS
Mitogen-NIL: >10 IU/mL
NIL: 0.02 IU/mL
QuantiFERON-TB Gold Plus: NEGATIVE
TB1-NIL: 0 IU/mL
TB2-NIL: 0 IU/mL

## 2021-08-03 ENCOUNTER — Other Ambulatory Visit: Payer: Self-pay | Admitting: Internal Medicine

## 2021-08-03 NOTE — Telephone Encounter (Signed)
Please refill as per office routine med refill policy (all routine meds to be refilled for 3 mo or monthly (per pt preference) up to one year from last visit, then month to month grace period for 3 mo, then further med refills will have to be denied) ? ?

## 2021-08-06 ENCOUNTER — Ambulatory Visit (INDEPENDENT_AMBULATORY_CARE_PROVIDER_SITE_OTHER): Payer: No Typology Code available for payment source | Admitting: Gastroenterology

## 2021-08-06 DIAGNOSIS — E538 Deficiency of other specified B group vitamins: Secondary | ICD-10-CM | POA: Diagnosis not present

## 2021-08-06 LAB — PANCREATIC ELASTASE, FECAL: Pancreatic Elastase-1, Stool: >500 mcg/g

## 2021-08-06 MED ORDER — CYANOCOBALAMIN 1000 MCG/ML IJ SOLN
1000.0000 ug | Freq: Once | INTRAMUSCULAR | Status: AC
  Administered 2021-08-06: 1000 ug via INTRAMUSCULAR

## 2021-08-06 NOTE — Progress Notes (Signed)
Pt presents today for nurse visit for an injection. Per Dr. Lyndel Safe, injection of B 12 given IM in right Thigh today by Remo Lipps RN Denies any adverse reactions or discomfort.

## 2021-08-06 NOTE — Patient Instructions (Signed)
Vitamin B12 Injection What is this medication? Vitamin B12 (VAHY tuh min B12) prevents and treats low vitamin B12 levels in your body. It is used in people who do not get enough vitamin B12 from their diet or when their digestive tract does not absorb enough. Vitamin B12 plays an important role in maintaining the health of your nervous system and red blood cells. This medicine may be used for other purposes; ask your health care provider or pharmacist if you have questions. COMMON BRAND NAME(S): B-12 Compliance Kit, B-12 Injection Kit, Cyomin, Dodex, LA-12, Nutri-Twelve, Physicians EZ Use B-12, Primabalt What should I tell my care team before I take this medication? They need to know if you have any of these conditions: Kidney disease Leber's disease Megaloblastic anemia An unusual or allergic reaction to cyanocobalamin, cobalt, other medications, foods, dyes, or preservatives Pregnant or trying to get pregnant Breast-feeding How should I use this medication? This medication is injected into a muscle or deeply under the skin. It is usually given in a clinic or care team's office. However, your care team may teach you how to inject yourself. Follow all instructions. Talk to your care team about the use of this medication in children. Special care may be needed. Overdosage: If you think you have taken too much of this medicine contact a poison control center or emergency room at once. NOTE: This medicine is only for you. Do not share this medicine with others. What if I miss a dose? If you are given your dose at a clinic or care team's office, call to reschedule your appointment. If you give your own injections, and you miss a dose, take it as soon as you can. If it is almost time for your next dose, take only that dose. Do not take double or extra doses. What may interact with this medication? Alcohol Colchicine This list may not describe all possible interactions. Give your health care  provider a list of all the medicines, herbs, non-prescription drugs, or dietary supplements you use. Also tell them if you smoke, drink alcohol, or use illegal drugs. Some items may interact with your medicine. What should I watch for while using this medication? Visit your care team regularly. You may need blood work done while you are taking this medication. You may need to follow a special diet. Talk to your care team. Limit your alcohol intake and avoid smoking to get the best benefit. What side effects may I notice from receiving this medication? Side effects that you should report to your care team as soon as possible: Allergic reactions--skin rash, itching, hives, swelling of the face, lips, tongue, or throat Swelling of the ankles, hands, or feet Trouble breathing Side effects that usually do not require medical attention (report to your care team if they continue or are bothersome): Diarrhea This list may not describe all possible side effects. Call your doctor for medical advice about side effects. You may report side effects to FDA at 1-800-FDA-1088. Where should I keep my medication? Keep out of the reach of children. Store at room temperature between 15 and 30 degrees C (59 and 85 degrees F). Protect from light. Throw away any unused medication after the expiration date. NOTE: This sheet is a summary. It may not cover all possible information. If you have questions about this medicine, talk to your doctor, pharmacist, or health care provider.  2023 Elsevier/Gold Standard (2020-11-05 00:00:00)

## 2021-08-08 ENCOUNTER — Encounter: Payer: Self-pay | Admitting: Hematology and Oncology

## 2021-08-08 ENCOUNTER — Telehealth: Payer: Self-pay | Admitting: Gastroenterology

## 2021-08-08 NOTE — Telephone Encounter (Signed)
Message sent to pre-cert team and to Rad Scheduling. Location for Bradley Donaldson has been changed to reflect insurance request. Appt at [facility] has also been canceled per insurance request. Called pt and provided with [facility] contact # [phone] and advised to reschedule his CT. Verbalized acceptance and understanding.

## 2021-08-11 NOTE — Telephone Encounter (Signed)
Patient scheduled for 09/03/21 at 10:00 am with [facility].

## 2021-08-12 ENCOUNTER — Ambulatory Visit (HOSPITAL_COMMUNITY): Payer: No Typology Code available for payment source

## 2021-08-13 LAB — CALPROTECTIN, FECAL: Calprotectin, Fecal: 154 ug/g — ABNORMAL HIGH (ref 0–120)

## 2021-08-18 ENCOUNTER — Inpatient Hospital Stay
Payer: No Typology Code available for payment source | Attending: Hematology and Oncology | Admitting: Hematology and Oncology

## 2021-08-18 DIAGNOSIS — D563 Thalassemia minor: Secondary | ICD-10-CM | POA: Diagnosis not present

## 2021-08-18 NOTE — Assessment & Plan Note (Signed)
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  RTC on an as needed basis

## 2021-08-18 NOTE — Progress Notes (Signed)
HEMATOLOGY-ONCOLOGY TELEPHONE VISIT PROGRESS NOTE  I connected with Bradley Donaldson on 08/18/21 at 11:30 AM EDT by telephone and verified that I am speaking with the correct person using two identifiers.  I discussed the limitations, risks, security and privacy concerns of performing an evaluation and management service by telephone and the availability of in person appointments.  I also discussed with the patient that there may be a patient responsible charge related to this service. The patient expressed understanding and agreed to proceed.   History of Present Illness: Phone follow up to discuss the results of blood work   REVIEW OF SYSTEMS:   Constitutional: Denies fevers, chills or abnormal weight loss All other systems were reviewed with the patient and are negative. Observations/Objective:     Assessment Plan:  Alpha thalassemia minor Alpha-thalassemia minor, alpha-/alpha- Mutation(s) identified:  alpha3.7 and alpha3.7  Based on 2/4 genes being affected, patient is an unaffected carrier of Alpha Thalassemia genes I discussed with the patient that this trait is unlikely to cause any health issues other than knowing that his MCV will always remain low and is not related to IDA.  Gets  B12 inj for B 12 def from GI  RTC on an as needed basis   I discussed the assessment and treatment plan with the patient. The patient was provided an opportunity to ask questions and all were answered. The patient agreed with the plan and demonstrated an understanding of the instructions. The patient was advised to call back or seek an in-person evaluation if the symptoms worsen or if the condition fails to improve as anticipated.   I provided 12 minutes of non-face-to-face time during this encounter. Harriette Ohara, MD

## 2021-09-03 ENCOUNTER — Ambulatory Visit
Admission: RE | Admit: 2021-09-03 | Discharge: 2021-09-03 | Disposition: A | Discharge status: Home / Self Care | Payer: No Typology Code available for payment source | Source: Ambulatory Visit | Attending: Gastroenterology | Admitting: Gastroenterology

## 2021-09-03 DIAGNOSIS — K219 Gastro-esophageal reflux disease without esophagitis: Secondary | ICD-10-CM

## 2021-09-03 DIAGNOSIS — E538 Deficiency of other specified B group vitamins: Secondary | ICD-10-CM

## 2021-09-03 DIAGNOSIS — D509 Iron deficiency anemia, unspecified: Secondary | ICD-10-CM

## 2021-09-03 DIAGNOSIS — K50019 Crohn's disease of small intestine with unspecified complications: Secondary | ICD-10-CM

## 2021-09-03 MED ORDER — IOPAMIDOL (ISOVUE-300) INJECTION 61%
100.0000 mL | Freq: Once | INTRAVENOUS | Status: AC | PRN
  Administered 2021-09-03: 100 mL via INTRAVENOUS

## 2021-09-23 ENCOUNTER — Telehealth: Payer: Self-pay | Admitting: Internal Medicine

## 2021-09-23 NOTE — Telephone Encounter (Signed)
Patient's records have been faxed.

## 2021-09-23 NOTE — Telephone Encounter (Signed)
[facility] called and requested we fax them the most recent office notes, imaging, and lab results for the pt  Pt has an appointment tomorrow, so please fax everything as soon as possible.   Fax: [phone]

## 2021-09-25 ENCOUNTER — Other Ambulatory Visit: Payer: Self-pay | Admitting: Urology

## 2021-09-26 ENCOUNTER — Other Ambulatory Visit (HOSPITAL_COMMUNITY): Payer: Self-pay | Admitting: Urology

## 2021-09-26 DIAGNOSIS — N200 Calculus of kidney: Secondary | ICD-10-CM

## 2021-10-02 ENCOUNTER — Encounter: Payer: Self-pay | Admitting: Gastroenterology

## 2021-10-06 ENCOUNTER — Other Ambulatory Visit: Payer: Self-pay | Admitting: Internal Medicine

## 2021-10-06 ENCOUNTER — Other Ambulatory Visit (INDEPENDENT_AMBULATORY_CARE_PROVIDER_SITE_OTHER): Payer: No Typology Code available for payment source

## 2021-10-06 DIAGNOSIS — E559 Vitamin D deficiency, unspecified: Secondary | ICD-10-CM | POA: Diagnosis not present

## 2021-10-06 DIAGNOSIS — R718 Other abnormality of red blood cells: Secondary | ICD-10-CM

## 2021-10-06 DIAGNOSIS — E538 Deficiency of other specified B group vitamins: Secondary | ICD-10-CM

## 2021-10-06 DIAGNOSIS — R739 Hyperglycemia, unspecified: Secondary | ICD-10-CM

## 2021-10-06 DIAGNOSIS — I10 Essential (primary) hypertension: Secondary | ICD-10-CM

## 2021-10-06 LAB — URINALYSIS, ROUTINE W REFLEX MICROSCOPIC
Bilirubin Urine: NEGATIVE
Ketones, ur: NEGATIVE
Leukocytes,Ua: NEGATIVE
Nitrite: NEGATIVE
Specific Gravity, Urine: 1.02 (ref 1.000–1.030)
Total Protein, Urine: NEGATIVE
Urine Glucose: NEGATIVE
Urobilinogen, UA: 0.2 (ref 0.0–1.0)
WBC, UA: NONE SEEN
pH: 6 (ref 5.0–8.0)

## 2021-10-06 LAB — CBC WITH DIFFERENTIAL/PLATELET
Basophils Absolute: 0.1 10*3/uL (ref 0.0–0.1)
Basophils Relative: 0.9 % (ref 0.0–3.0)
Eosinophils Absolute: 0.1 10*3/uL (ref 0.0–0.7)
Eosinophils Relative: 0.9 % (ref 0.0–5.0)
HCT: 40.3 % (ref 39.0–52.0)
Hemoglobin: 12.9 g/dL — ABNORMAL LOW (ref 13.0–17.0)
Lymphocytes Relative: 24.1 % (ref 12.0–46.0)
Lymphs Abs: 2.1 10*3/uL (ref 0.7–4.0)
MCHC: 32 g/dL (ref 30.0–36.0)
MCV: 67.7 fl — ABNORMAL LOW (ref 78.0–100.0)
Monocytes Absolute: 0.6 10*3/uL (ref 0.1–1.0)
Monocytes Relative: 7.3 % (ref 3.0–12.0)
Neutro Abs: 5.8 10*3/uL (ref 1.4–7.7)
Neutrophils Relative %: 66.8 % (ref 43.0–77.0)
Platelets: 325 10*3/uL (ref 150.0–400.0)
RBC: 5.95 Mil/uL — ABNORMAL HIGH (ref 4.22–5.81)
RDW: 16.8 % — ABNORMAL HIGH (ref 11.5–15.5)
WBC: 8.6 10*3/uL (ref 4.0–10.5)

## 2021-10-06 LAB — LIPID PANEL
Cholesterol: 139 mg/dL (ref 0–200)
HDL: 37.8 mg/dL — ABNORMAL LOW (ref >39.00)
LDL Cholesterol: 78 mg/dL (ref 0–99)
NonHDL: 100.76
Total CHOL/HDL Ratio: 4
Triglycerides: 114 mg/dL (ref 0.0–149.0)
VLDL: 22.8 mg/dL (ref 0.0–40.0)

## 2021-10-06 LAB — HEPATIC FUNCTION PANEL
ALT: 21 U/L (ref 0–53)
AST: 17 U/L (ref 0–37)
Albumin: 4.4 g/dL (ref 3.5–5.2)
Alkaline Phosphatase: 64 U/L (ref 39–117)
Bilirubin, Direct: 0.1 mg/dL (ref 0.0–0.3)
Total Bilirubin: 0.6 mg/dL (ref 0.2–1.2)
Total Protein: 8.2 g/dL (ref 6.0–8.3)

## 2021-10-06 LAB — BASIC METABOLIC PANEL WITH GFR
BUN: 13 mg/dL (ref 6–23)
CO2: 24 meq/L (ref 19–32)
Calcium: 9.3 mg/dL (ref 8.4–10.5)
Chloride: 101 meq/L (ref 96–112)
Creatinine, Ser: 1.28 mg/dL (ref 0.40–1.50)
GFR: 64.82 mL/min
Glucose, Bld: 87 mg/dL (ref 70–99)
Potassium: 4 meq/L (ref 3.5–5.1)
Sodium: 137 meq/L (ref 135–145)

## 2021-10-06 LAB — VITAMIN D 25 HYDROXY (VIT D DEFICIENCY, FRACTURES): VITD: 12.44 ng/mL — ABNORMAL LOW (ref 30.00–100.00)

## 2021-10-06 LAB — TSH: TSH: 0.96 u[IU]/mL (ref 0.35–5.50)

## 2021-10-06 LAB — HEMOGLOBIN A1C: Hgb A1c MFr Bld: 6.5 % (ref 4.6–6.5)

## 2021-10-06 LAB — PSA: PSA: 1.94 ng/mL (ref 0.10–4.00)

## 2021-10-06 LAB — VITAMIN B12: Vitamin B-12: >1500 pg/mL — ABNORMAL HIGH (ref 211–911)

## 2021-10-08 ENCOUNTER — Encounter: Payer: No Typology Code available for payment source | Admitting: Internal Medicine

## 2021-10-09 ENCOUNTER — Ambulatory Visit (INDEPENDENT_AMBULATORY_CARE_PROVIDER_SITE_OTHER): Payer: No Typology Code available for payment source | Admitting: Internal Medicine

## 2021-10-09 ENCOUNTER — Encounter: Payer: Self-pay | Admitting: Internal Medicine

## 2021-10-09 VITALS — BP 112/78 | HR 81 | Temp 98.8°F | Ht 64.0 in | Wt 185.0 lb

## 2021-10-09 DIAGNOSIS — E559 Vitamin D deficiency, unspecified: Secondary | ICD-10-CM

## 2021-10-09 DIAGNOSIS — Z125 Encounter for screening for malignant neoplasm of prostate: Secondary | ICD-10-CM

## 2021-10-09 DIAGNOSIS — N200 Calculus of kidney: Secondary | ICD-10-CM

## 2021-10-09 DIAGNOSIS — I10 Essential (primary) hypertension: Secondary | ICD-10-CM

## 2021-10-09 DIAGNOSIS — D563 Thalassemia minor: Secondary | ICD-10-CM

## 2021-10-09 DIAGNOSIS — E538 Deficiency of other specified B group vitamins: Secondary | ICD-10-CM | POA: Diagnosis not present

## 2021-10-09 DIAGNOSIS — Z1159 Encounter for screening for other viral diseases: Secondary | ICD-10-CM | POA: Diagnosis not present

## 2021-10-09 DIAGNOSIS — R739 Hyperglycemia, unspecified: Secondary | ICD-10-CM | POA: Diagnosis not present

## 2021-10-09 DIAGNOSIS — Z0001 Encounter for general adult medical examination with abnormal findings: Secondary | ICD-10-CM

## 2021-10-09 LAB — HEMOGLOBINOPATHY EVALUATION
Fetal Hemoglobin Testing: <1 % (ref 0.0–1.9)
HCT: 44.6 % (ref 38.5–50.0)
Hemoglobin A2 - HGBRFX: 2.1 % — ABNORMAL LOW (ref 2.2–3.2)
Hemoglobin: 13.2 g/dL (ref 13.2–17.1)
Hgb A: 97.9 % (ref >96.0)
MCH: 21.5 pg — ABNORMAL LOW (ref 27.0–33.0)
MCV: 72.5 fL — ABNORMAL LOW (ref 80.0–100.0)
RBC: 6.15 10*6/uL — ABNORMAL HIGH (ref 4.20–5.80)
RDW: 18.9 % — ABNORMAL HIGH (ref 11.0–15.0)

## 2021-10-09 NOTE — Assessment & Plan Note (Signed)
Last vitamin D Lab Results  Component Value Date   VD25OH 12.44 (L) 10/06/2021   Low, to start oral replacement

## 2021-10-09 NOTE — Assessment & Plan Note (Signed)
Lab Results  Component Value Date   VITAMINB12 >1500 (H) 10/06/2021   Stable, cont IM replacement due to bowel disease - b12 1000 mcg q mo

## 2021-10-09 NOTE — Assessment & Plan Note (Signed)
BP Readings from Last 3 Encounters:  10/09/21 112/78  07/29/21 130/86  07/17/21 122/72   Stable, pt to continue medical treatment azor 5 '40mg'$  qd, hct 12.5 mg qd

## 2021-10-09 NOTE — Patient Instructions (Addendum)
Please have your Shingrix (shingles) shots done at your local pharmacy - after the upcoming procedure  Please take OTC Vitamin D3 at 2000 units per day, indefinitely    Please continue all other medications as before, and refills have been done if requested.  Please have the pharmacy call with any other refills you may need.  Please continue your efforts at being more active, low cholesterol diet, and weight control.  You are otherwise up to date with prevention measures today.  Please keep your appointments with your specialists as you may have planned  Please make an Appointment to return for your 1 year visit, or sooner if needed, with Lab testing by Appointment as well, to be done about 3-5 days before at the [facility] (so this is for TWO appointments - please see the scheduling desk as you leave)

## 2021-10-09 NOTE — Assessment & Plan Note (Signed)
Asympt, d/w pt, cont to follow

## 2021-10-09 NOTE — Progress Notes (Signed)
Patient ID: Bradley Donaldson, male   DOB: 1970-05-27, 51 y.o.   MRN: [id number]         Chief Complaint:: wellness exam and low vit d, thallasemia, right renal stone, hyperglycemia       HPI:  Bradley Donaldson is a 51 y.o. male here for wellness exam; due for hep c screen, will have shingri at pharmacy, o/w up to date                  Also s/p covid in jan 2023 with lingering cough, now resolved.  Pt denies chest pain, increased sob or doe, wheezing, orthopnea, PND, increased LE swelling, palpitations, dizziness or syncope.   Pt denies polydipsia, polyuria, or new focal neuro s/s.    Pt denies fever, wt loss, night sweats, loss of appetite, or other constitutional symptoms  Has known right renal stone, large, for open surgury for removal per urology in approx 2 wks.       Wt Readings from Last 3 Encounters:  10/09/21 185 lb (83.9 kg)  07/29/21 190 lb (86.2 kg)  07/17/21 190 lb 9.6 oz (86.5 kg)   BP Readings from Last 3 Encounters:  10/09/21 112/78  07/29/21 130/86  07/17/21 122/72   Immunization History  Administered Date(s) Administered   Influenza Split 01/18/2012   Influenza Whole 12/09/2007   Influenza,inj,Quad PF,6+ Mos 02/27/2016, 05/04/2018, 12/24/2018   PFIZER(Purple Top)SARS-COV-2 Vaccination 05/20/2019, 06/10/2019, 02/16/2020, 11/18/2020   Tdap 02/27/2016   Health Maintenance Due  Topic Date Due   Hepatitis C Screening  Never done      Past Medical History:  Diagnosis Date   ALLERGIC RHINITIS 03/18/2007   ANEMIA-IRON DEFICIENCY 03/18/2007   hx of   Anxiety    work related stress-   [place] DISEASE 01/04/2007   DERMATITIS, SCALP 12/09/2007   GERD (gastroesophageal reflux disease)    OTC meds PRN   HYPERTENSION 01/04/2007   on meds   Incisional hernia 06/18/2010   Seasonal allergies    Sleep apnea    uses CPAP   URI 12/09/2007   Past Surgical History:  Procedure Laterality Date   COLONOSCOPY  2019   RG-prep exc   partial colon removal  1998   Right  hemicolectomy    reports that he has never smoked. He has never used smokeless tobacco. He reports that he does not currently use alcohol. He reports that he does not use drugs. family history includes Heart disease in his father; Hypertension in an other family member. Allergies  Allergen Reactions   Ranitidine Hcl    Current Outpatient Medications on File Prior to Visit  Medication Sig Dispense Refill   amLODipine-olmesartan (AZOR) 5-40 MG tablet Take 1 tablet by mouth once daily 90 tablet 3   cyanocobalamin (,VITAMIN B-12,) 1000 MCG/ML injection Inject 1 mL (1,000 mcg total) into the muscle every 30 (thirty) days. 1 mL 11   Fluocinolone Acetonide Scalp 0.01 % OIL SMARTSIG:sparingly Topical     hydrochlorothiazide (HYDRODIURIL) 25 MG tablet Take 1/2 (one-half) tablet by mouth once daily 45 tablet 3   triamcinolone (KENALOG) 0.025 % ointment Apply topically.     Ascorbic Acid (VITAMIN C PO) Take 1 tablet by mouth daily at 6 (six) AM. (Patient not taking: Reported on 10/09/2021)     fexofenadine (ALLEGRA) 60 MG tablet  (Patient not taking: Reported on 10/09/2021)     omeprazole (PRILOSEC) 20 MG capsule Take 1 capsule (20 mg total) by mouth daily. (Patient not taking: Reported on 10/09/2021) 90  capsule 3   No current facility-administered medications on file prior to visit.        ROS:  All others reviewed and negative.  Objective        PE:  BP 112/78 (BP Location: Right Arm, Patient Position: Sitting, Cuff Size: Large)   Pulse 81   Temp 98.8 F (37.1 C) (Oral)   Ht '5\' 4"'$  (1.626 m)   Wt 185 lb (83.9 kg)   SpO2 99%   BMI 31.76 kg/m                 Constitutional: Pt appears in NAD               HENT: Head: NCAT.                Right Ear: External ear normal.                 Left Ear: External ear normal.                Eyes: . Pupils are equal, round, and reactive to light. Conjunctivae and EOM are normal               Nose: without d/c or deformity               Neck: Neck supple.  Gross normal ROM               Cardiovascular: Normal rate and regular rhythm.                 Pulmonary/Chest: Effort normal and breath sounds without rales or wheezing.                Abd:  Soft, NT, ND, + BS, no organomegaly               Neurological: Pt is alert. At baseline orientation, motor grossly intact               Skin: Skin is warm. No rashes, no other new lesions, LE edema - none               Psychiatric: Pt behavior is normal without agitation, mild depressed affect  Micro: none  Cardiac tracings I have personally interpreted today:  none  Pertinent Radiological findings (summarize): none   Lab Results  Component Value Date   WBC 8.6 10/06/2021   HGB 13.2 10/06/2021   HCT 44.6 10/06/2021   PLT 325.0 10/06/2021   GLUCOSE 87 10/06/2021   CHOL 139 10/06/2021   TRIG 114.0 10/06/2021   HDL 37.80 (L) 10/06/2021   LDLDIRECT 76.6 02/21/2013   LDLCALC 78 10/06/2021   ALT 21 10/06/2021   AST 17 10/06/2021   NA 137 10/06/2021   K 4.0 10/06/2021   CL 101 10/06/2021   CREATININE 1.28 10/06/2021   BUN 13 10/06/2021   CO2 24 10/06/2021   TSH 0.96 10/06/2021   PSA 1.94 10/06/2021   HGBA1C 6.5 10/06/2021   Assessment/Plan:  Bradley Donaldson is a 51 y.o. Black or African American [2] male with  has a past medical history of ALLERGIC RHINITIS (03/18/2007), ANEMIA-IRON DEFICIENCY (03/18/2007), Anxiety, CROHN'S DISEASE (01/04/2007), DERMATITIS, SCALP (12/09/2007), GERD (gastroesophageal reflux disease), HYPERTENSION (01/04/2007), Incisional hernia (06/18/2010), Seasonal allergies, Sleep apnea, and URI (12/09/2007).  Vitamin D deficiency Last vitamin D Lab Results  Component Value Date   VD25OH 12.44 (L) 10/06/2021   Low, to start oral replacement   Encounter for well adult exam with  abnormal findings Age and sex appropriate education and counseling updated with regular exercise and diet Referrals for preventative services - for hep c screen Immunizations addressed - for  shingrix to be done at the local pharmacy Smoking counseling  - none needed Evidence for depression or other mood disorder - mild chronic, declines tx or referrals Most recent labs reviewed. I have personally reviewed and have noted: 1) the patient's medical and social history 2) The patient's current medications and supplements 3) The patient's height, weight, and BMI have been recorded in the chart   Essential hypertension BP Readings from Last 3 Encounters:  10/09/21 112/78  07/29/21 130/86  07/17/21 122/72   Stable, pt to continue medical treatment azor 5 '40mg'$  qd, hct 12.5 mg qd  Hyperglycemia Lab Results  Component Value Date   HGBA1C 6.5 10/06/2021   Stable, pt to continue current medical treatment - borderline elevated but stable, cont wt control, diet   B12 deficiency Lab Results  Component Value Date   VITAMINB12 >1500 (H) 10/06/2021   Stable, cont IM replacement due to bowel disease - b12 1000 mcg q mo  Alpha thalassemia minor Asympt, d/w pt, cont to follow  Right renal stone With microhematuria on UA - for open surgury for removal in approx 2 wks  Followup: Return in about 1 year (around 10/10/2022).  Cathlean Cower, MD 10/09/2021 9:02 AM [facility]

## 2021-10-09 NOTE — Assessment & Plan Note (Signed)
With microhematuria on UA - for open surgury for removal in approx 2 wks

## 2021-10-09 NOTE — Assessment & Plan Note (Signed)
Lab Results  Component Value Date   HGBA1C 6.5 10/06/2021   Stable, pt to continue current medical treatment - borderline elevated but stable, cont wt control, diet

## 2021-10-09 NOTE — Assessment & Plan Note (Signed)
Age and sex appropriate education and counseling updated with regular exercise and diet Referrals for preventative services - for hep c screen Immunizations addressed - for shingrix to be done at the local pharmacy Smoking counseling  - none needed Evidence for depression or other mood disorder - mild chronic, declines tx or referrals Most recent labs reviewed. I have personally reviewed and have noted: 1) the patient's medical and social history 2) The patient's current medications and supplements 3) The patient's height, weight, and BMI have been recorded in the chart

## 2021-10-16 NOTE — Patient Instructions (Signed)
DUE TO COVID-19 ONLY TWO VISITORS  (aged 51 and older)  ARE ALLOWED TO COME WITH YOU AND STAY IN THE WAITING ROOM ONLY DURING PRE OP AND PROCEDURE.   **NO VISITORS ARE ALLOWED IN THE SHORT STAY AREA OR RECOVERY ROOM!!**  IF YOU WILL BE ADMITTED INTO THE HOSPITAL YOU ARE ALLOWED ONLY FOUR SUPPORT PEOPLE DURING VISITATION HOURS ONLY (7 AM -8PM)   The support person(s) must pass our screening, gel in and out, and wear a mask at all times, including in the patient's room. Patients must also wear a mask when staff or their support person are in the room. Visitors GUEST BADGE MUST BE WORN VISIBLY  One adult visitor may remain with you overnight and MUST be in the room by 8 P.M.     Your procedure is scheduled on: 10/24/21   Report to [facility] Main Entrance    Report to admitting at : 7:30 AM   Call this number if you have problems the morning of surgery [phone]   Do not eat food :After Midnight.  FOLLOW BOWEL PREP AND ANY ADDITIONAL PRE OP INSTRUCTIONS YOU RECEIVED FROM YOUR SURGEON'S OFFICE!!!   Oral Hygiene is also important to reduce your risk of infection.                                    Remember - BRUSH YOUR TEETH THE MORNING OF SURGERY WITH YOUR REGULAR TOOTHPASTE   Do NOT smoke after Midnight   Take these medicines the morning of surgery with A SIP OF WATER: N/A  DO NOT TAKE ANY ORAL DIABETIC MEDICATIONS DAY OF YOUR SURGERY  Bring CPAP mask and tubing day of surgery.                              You may not have any metal on your body including hair pins, jewelry, and body piercing             Do not wear lotions, powders, perfumes/cologne, or deodorant              Men may shave face and neck.   Do not bring valuables to the hospital. [place].   Contacts, dentures or bridgework may not be worn into surgery.   Bring small overnight bag day of surgery.   DO NOT [place]. PHARMACY WILL DISPENSE MEDICATIONS LISTED ON YOUR MEDICATION LIST TO YOU DURING YOUR ADMISSION [facility]!    Patients discharged on the day of surgery will not be allowed to drive home.  Someone NEEDS to stay with you for the first 24 hours after anesthesia.   Special Instructions: Bring a copy of your healthcare power of attorney and living will documents         the day of surgery if you haven't scanned them before.              Please read over the following fact sheets you were given: IF YOU HAVE QUESTIONS ABOUT YOUR PRE-OP INSTRUCTIONS PLEASE CALL [phone]     [facility] - Preparing for Surgery Before surgery, you can play an important role.  Because skin is not sterile, your skin needs to be as free of  germs as possible.  You can reduce the number of germs on your skin by washing with CHG (chlorahexidine gluconate) soap before surgery.  CHG is an antiseptic cleaner which kills germs and bonds with the skin to continue killing germs even after washing. Please DO NOT use if you have an allergy to CHG or antibacterial soaps.  If your skin becomes reddened/irritated stop using the CHG and inform your nurse when you arrive at Short Stay. Do not shave (including legs and underarms) for at least 48 hours prior to the first CHG shower.  You may shave your face/neck. Please follow these instructions carefully:  1.  Shower with CHG Soap the night before surgery and the  morning of Surgery.  2.  If you choose to wash your hair, wash your hair first as usual with your  normal  shampoo.  3.  After you shampoo, rinse your hair and body thoroughly to remove the  shampoo.                           4.  Use CHG as you would any other liquid soap.  You can apply chg directly  to the skin and wash                       Gently with a scrungie or clean washcloth.  5.  Apply the CHG Soap to your body ONLY FROM THE NECK DOWN.   Do not use on face/ open                           Wound or open  sores. Avoid contact with eyes, ears mouth and genitals (private parts).                       Wash face,  Genitals (private parts) with your normal soap.             6.  Wash thoroughly, paying special attention to the area where your surgery  will be performed.  7.  Thoroughly rinse your body with warm water from the neck down.  8.  DO NOT shower/wash with your normal soap after using and rinsing off  the CHG Soap.                9.  Pat yourself dry with a clean towel.            10.  Wear clean pajamas.            11.  Place clean sheets on your bed the night of your first shower and do not  sleep with pets. Day of Surgery : Do not apply any lotions/deodorants the morning of surgery.  Please wear clean clothes to the hospital/surgery center.  FAILURE TO FOLLOW THESE INSTRUCTIONS MAY RESULT IN THE CANCELLATION OF YOUR SURGERY PATIENT SIGNATURE_________________________________  NURSE SIGNATURE__________________________________  ________________________________________________________________________

## 2021-10-17 ENCOUNTER — Encounter (HOSPITAL_COMMUNITY)
Admission: RE | Admit: 2021-10-17 | Discharge: 2021-10-17 | Disposition: A | Discharge status: Home / Self Care | Payer: No Typology Code available for payment source | Source: Ambulatory Visit | Attending: Urology | Admitting: Urology

## 2021-10-17 ENCOUNTER — Other Ambulatory Visit: Payer: Self-pay

## 2021-10-17 ENCOUNTER — Encounter (HOSPITAL_COMMUNITY): Payer: Self-pay

## 2021-10-17 VITALS — BP 134/83 | HR 86 | Temp 99.2°F | Ht 64.0 in | Wt 185.0 lb

## 2021-10-17 DIAGNOSIS — I10 Essential (primary) hypertension: Secondary | ICD-10-CM | POA: Insufficient documentation

## 2021-10-17 DIAGNOSIS — Z01818 Encounter for other preprocedural examination: Secondary | ICD-10-CM | POA: Diagnosis present

## 2021-10-17 HISTORY — DX: Personal history of urinary calculi: Z87.442

## 2021-10-17 LAB — BASIC METABOLIC PANEL
Anion gap: 6 (ref 5–15)
BUN: 15 mg/dL (ref 6–20)
CO2: 24 mmol/L (ref 22–32)
Calcium: 9 mg/dL (ref 8.9–10.3)
Chloride: 109 mmol/L (ref 98–111)
Creatinine, Ser: 1.43 mg/dL — ABNORMAL HIGH (ref 0.61–1.24)
GFR, Estimated: 59 mL/min — ABNORMAL LOW (ref >60)
Glucose, Bld: 82 mg/dL (ref 70–99)
Potassium: 4.5 mmol/L (ref 3.5–5.1)
Sodium: 139 mmol/L (ref 135–145)

## 2021-10-17 LAB — CBC
HCT: 40.3 % (ref 39.0–52.0)
Hemoglobin: 12.4 g/dL — ABNORMAL LOW (ref 13.0–17.0)
MCH: 21.8 pg — ABNORMAL LOW (ref 26.0–34.0)
MCHC: 30.8 g/dL (ref 30.0–36.0)
MCV: 70.8 fL — ABNORMAL LOW (ref 80.0–100.0)
Platelets: 385 10*3/uL (ref 150–400)
RBC: 5.69 MIL/uL (ref 4.22–5.81)
RDW: 18 % — ABNORMAL HIGH (ref 11.5–15.5)
WBC: 9.3 10*3/uL (ref 4.0–10.5)
nRBC: 0 % (ref 0.0–0.2)

## 2021-10-17 NOTE — Progress Notes (Signed)
For Short Stay: [place] appointment date: Date of COVID positive in last 37 days:  Bowel Prep reminder:   For Anesthesia: PCP - Dr. Cathlean Cower. LOV: 10/09/21 Cardiologist -   Chest x-ray -  EKG -  Stress Test -  ECHO -  Cardiac Cath -  Pacemaker/ICD device last checked: Pacemaker orders received: Device Rep notified:  Spinal Cord Stimulator:  Sleep Study - Yes CPAP - Yes  Fasting Blood Sugar -  Checks Blood Sugar _____ times a day Date and result of last Hgb A1c-  Blood Thinner Instructions: Aspirin Instructions: Last Dose:  Activity level: Can go up a flight of stairs and activities of daily living without stopping and without chest pain and/or shortness of breath   Able to exercise without chest pain and/or shortness of breath   Unable to go up a flight of stairs without chest pain and/or shortness of breath     Anesthesia review: Hx: HTN,OSA(CPAP)  Patient denies shortness of breath, fever, cough and chest pain at PAT appointment   Patient verbalized understanding of instructions that were given to them at the PAT appointment. Patient was also instructed that they will need to review over the PAT instructions again at home before surgery.

## 2021-10-23 ENCOUNTER — Other Ambulatory Visit: Payer: Self-pay | Admitting: Internal Medicine

## 2021-10-23 DIAGNOSIS — N200 Calculus of kidney: Secondary | ICD-10-CM

## 2021-10-23 NOTE — H&P (Incomplete)
Chief Complaint: Patient was seen in consultation today for renal calculi  Referring Physician(s): Bell,Eugene D III  Supervising Physician: Jacqulynn Cadet  Patient Status: [facility] - Out-pt  History of Present Illness: Bradley Donaldson is a 51 y.o. male with history of Crohn's disease, GERD, HTN who was found to have a large right staghorn renal calculi.  He has plans for intraoperative stone removal today with Dr. Gloriann Loan.  Request made for right nephroureteral stent placement prior to OR.   Patient presents to [facility] in his usual state of health today.  He denies fever, chills, nausea,vomiting, weight loss, hematuria.   Past Medical History:  Diagnosis Date  . ALLERGIC RHINITIS 03/18/2007  . ANEMIA-IRON DEFICIENCY 03/18/2007   hx of  . Anxiety    work related stress-  . CROHN'S DISEASE 01/04/2007  . DERMATITIS, SCALP 12/09/2007  . GERD (gastroesophageal reflux disease)    OTC meds PRN  . History of kidney stones   . HYPERTENSION 01/04/2007   on meds  . Incisional hernia 06/18/2010  . Seasonal allergies   . Sleep apnea    uses CPAP  . URI 12/09/2007    Past Surgical History:  Procedure Laterality Date  . COLONOSCOPY  2019   RG-prep exc  . partial colon removal  1998   Right hemicolectomy    Allergies: Ranitidine hcl  Medications: Prior to Admission medications   Medication Sig Start Date End Date Taking? Authorizing Provider  amLODipine-olmesartan (AZOR) 5-40 MG tablet Take 1 tablet by mouth once daily 08/05/21   Biagio Borg, MD  cyanocobalamin (,VITAMIN B-12,) 1000 MCG/ML injection Inject 1 mL (1,000 mcg total) into the muscle every 30 (thirty) days. 07/30/21   Jackquline Denmark, MD  Fluocinolone Acetonide Scalp 0.01 % OIL Apply 1 Application topically every 7 (seven) days. 05/19/21   [provider]  hydrochlorothiazide (HYDRODIURIL) 25 MG tablet Take 1/2 (one-half) tablet by mouth once daily 08/05/21   Biagio Borg, MD  omeprazole (PRILOSEC) 20 MG  capsule Take 1 capsule (20 mg total) by mouth daily. Patient not taking: Reported on 10/09/2021 07/29/21   Jackquline Denmark, MD  tacrolimus (PROTOPIC) 0.03 % ointment Apply 1 Application topically daily.    [provider]  triamcinolone (KENALOG) 0.025 % ointment Apply 1 Application topically daily. 05/19/21   [provider]     Family History  Problem Relation Age of Onset  . Heart disease Father   . Hypertension Other   . Colon cancer Neg Hx   . Esophageal cancer Neg Hx   . Rectal cancer Neg Hx   . Stomach cancer Neg Hx   . Colon polyps Neg Hx     Social History   Socioeconomic History  . Marital status: Married    Spouse name: Not on file  . Number of children: 2  . Years of education: Not on file  . Highest education level: Not on file  Occupational History  . Occupation: works at home  Tobacco Use  . Smoking status: Never  . Smokeless tobacco: Never  Vaping Use  . Vaping Use: Never used  Substance and Sexual Activity  . Alcohol use: Yes    Alcohol/week: 0.0 - 1.0 standard drinks of alcohol    Comment: occas.  . Drug use: No  . Sexual activity: Not on file  Other Topics Concern  . Not on file  Social History Narrative  . Not on file   Social Determinants of Health   Financial Resource Strain: Not  on file  Food Insecurity: Not on file  Transportation Needs: Not on file  Physical Activity: Not on file  Stress: Not on file  Social Connections: Not on file     Review of Systems: A 12 point ROS discussed and pertinent positives are indicated in the HPI above.  All other systems are negative.  Review of Systems  Vital Signs: There were no vitals taken for this visit.  Physical Exam       Imaging: No results found.  Labs:  CBC: Recent Labs    05/09/21 1218 07/29/21 0919 10/06/21 1244 10/06/21 1247 10/17/21 1322  WBC 10.2 8.3 8.6  --  9.3  HGB 11.5* 12.2* 12.9* 13.2 12.4*  HCT 36.3* 38.4* 40.3 44.6 40.3  PLT 412.0* 367.0  325.0  --  385    COAGS: No results for input(s): "INR", "APTT" in the last 8760 hours.  BMP: Recent Labs    05/09/21 1218 07/29/21 0919 10/06/21 1244 10/17/21 1322  NA 139 140 137 139  K 3.8 3.7 4.0 4.5  CL 103 102 101 109  CO2 27 30 24 24   GLUCOSE 85 105* 87 82  BUN 13 10 13 15   CALCIUM 8.9 9.7 9.3 9.0  CREATININE 1.31 1.30 1.28 1.43*  GFRNONAA  --   --   --  59*    LIVER FUNCTION TESTS: Recent Labs    05/09/21 1218 07/29/21 0919 10/06/21 1244  BILITOT 0.7 0.4 0.6  AST 15 16 17   ALT 17 20 21   ALKPHOS 58 63 64  PROT 7.8 8.3 8.2  ALBUMIN 4.0 4.4 4.4    TUMOR MARKERS: No results for input(s): "AFPTM", "CEA", "CA199", "CHROMGRNA" in the last 8760 hours.  Assessment and Plan:  ***  Thank you for this interesting consult.  I greatly enjoyed meeting Bradley Donaldson and look forward to participating in their care.  A copy of this report was sent to the requesting provider on this date.  Electronically Signed: Docia Barrier, PA 10/24/2021, 12:00 AM   I spent a total of {New GURK:[id number]} {New Out-Pt:[id number]}  {Established Out-Pt:[id number]} in face to face in clinical consultation, greater than 50% of which was counseling/coordinating care for ***

## 2021-10-23 NOTE — Anesthesia Preprocedure Evaluation (Addendum)
Anesthesia Evaluation  Patient identified by MRN, date of birth, ID band Patient awake    Reviewed: Allergy & Precautions, NPO status , Patient's Chart, lab work & pertinent test results  History of Anesthesia Complications Negative for: history of anesthetic complications  Airway Mallampati: III  TM Distance: >3 FB Neck ROM: Full    Dental no notable dental hx.    Pulmonary sleep apnea and Continuous Positive Airway Pressure Ventilation ,    Pulmonary exam normal        Cardiovascular hypertension, Pt. on medications Normal cardiovascular exam     Neuro/Psych Anxiety negative neurological ROS     GI/Hepatic Neg liver ROS, GERD  Controlled and Medicated,  Endo/Other  negative endocrine ROS  Renal/GU RIGHT RENAL STONE, Cr 1.43  negative genitourinary   Musculoskeletal negative musculoskeletal ROS (+)   Abdominal   Peds  Hematology  (+) Blood dyscrasia (Hgb 12.4), anemia ,   Anesthesia Other Findings Day of surgery medications reviewed with patient.  Reproductive/Obstetrics negative OB ROS                            Anesthesia Physical Anesthesia Plan  ASA: 2  Anesthesia Plan: General   Post-op Pain Management: Tylenol PO (pre-op)*   Induction: Intravenous  PONV Risk Score and Plan: 2 and Treatment may vary due to age or medical condition, Ondansetron, Dexamethasone and Midazolam  Airway Management Planned: Oral ETT  Additional Equipment: None  Intra-op Plan:   Post-operative Plan: Extubation in OR  Informed Consent: I have reviewed the patients History and Physical, chart, labs and discussed the procedure including the risks, benefits and alternatives for the proposed anesthesia with the patient or authorized representative who has indicated his/her understanding and acceptance.     Dental advisory given  Plan Discussed with: CRNA  Anesthesia Plan Comments:         Anesthesia Quick Evaluation

## 2021-10-23 NOTE — H&P (Signed)
Chief Complaint: Patient was seen in consultation today for renal calculi  Referring Physician(s): Bell,Eugene D III  Supervising Physician: Jacqulynn Cadet  Patient Status: [facility] - Out-pt  History of Present Illness: Bradley Donaldson is a 51 y.o. male with history of Crohn's disease, GERD, HTN who was found to have a large right staghorn renal calculi.  He has plans for intraoperative stone removal today with Dr. Gloriann Loan.  Request made for right nephroureteral stent placement prior to OR.   Patient presents to [facility] Radiology in his usual state of health today.  He denies fever, chills, nausea,vomiting, weight loss, gross hematuria. He is aware of the plan for procedure today followed by OR with Dr. Gloriann Loan.  He is agreeable to proceed.   Past Medical History:  Diagnosis Date   ALLERGIC RHINITIS 03/18/2007   ANEMIA-IRON DEFICIENCY 03/18/2007   hx of   Anxiety    work related stress-   CROHN'S DISEASE 01/04/2007   DERMATITIS, SCALP 12/09/2007   GERD (gastroesophageal reflux disease)    OTC meds PRN   History of kidney stones    HYPERTENSION 01/04/2007   on meds   Incisional hernia 06/18/2010   Seasonal allergies    Sleep apnea    uses CPAP   URI 12/09/2007    Past Surgical History:  Procedure Laterality Date   COLONOSCOPY  2019   RG-prep exc   partial colon removal  1998   Right hemicolectomy    Allergies: Ranitidine hcl  Medications: Prior to Admission medications   Medication Sig Start Date End Date Taking? Authorizing Provider  amLODipine-olmesartan (AZOR) 5-40 MG tablet Take 1 tablet by mouth once daily 08/05/21   Biagio Borg, MD  cyanocobalamin (,VITAMIN B-12,) 1000 MCG/ML injection Inject 1 mL (1,000 mcg total) into the muscle every 30 (thirty) days. 07/30/21   Jackquline Denmark, MD  Fluocinolone Acetonide Scalp 0.01 % OIL Apply 1 Application topically every 7 (seven) days. 05/19/21   [provider]  hydrochlorothiazide (HYDRODIURIL) 25 MG tablet Take 1/2  (one-half) tablet by mouth once daily 08/05/21   Biagio Borg, MD  omeprazole (PRILOSEC) 20 MG capsule Take 1 capsule (20 mg total) by mouth daily. Patient not taking: Reported on 10/09/2021 07/29/21   Jackquline Denmark, MD  tacrolimus (PROTOPIC) 0.03 % ointment Apply 1 Application topically daily.    [provider]  triamcinolone (KENALOG) 0.025 % ointment Apply 1 Application topically daily. 05/19/21   [provider]     Family History  Problem Relation Age of Onset   Heart disease Father    Hypertension Other    Colon cancer Neg Hx    Esophageal cancer Neg Hx    Rectal cancer Neg Hx    Stomach cancer Neg Hx    Colon polyps Neg Hx     Social History   Socioeconomic History   Marital status: Married    Spouse name: Not on file   Number of children: 2   Years of education: Not on file   Highest education level: Not on file  Occupational History   Occupation: works at home  Tobacco Use   Smoking status: Never   Smokeless tobacco: Never  Vaping Use   Vaping Use: Never used  Substance and Sexual Activity   Alcohol use: Yes    Alcohol/week: 0.0 - 1.0 standard drinks of alcohol    Comment: occas.   Drug use: No   Sexual activity: Not on file  Other Topics Concern   Not on  file  Social History Narrative   Not on file   Social Determinants of Health   Financial Resource Strain: Not on file  Food Insecurity: Not on file  Transportation Needs: Not on file  Physical Activity: Not on file  Stress: Not on file  Social Connections: Not on file     Review of Systems: A 12 point ROS discussed and pertinent positives are indicated in the HPI above.  All other systems are negative.  Review of Systems  Constitutional:  Negative for fatigue and fever.  Respiratory:  Negative for cough and shortness of breath.   Gastrointestinal:  Negative for abdominal pain and nausea.  Genitourinary:  Negative for difficulty urinating, dysuria and hematuria.  Musculoskeletal:   Negative for back pain.  Psychiatric/Behavioral:  Negative for behavioral problems and confusion.     Vital Signs: There were no vitals taken for this visit.  Physical Exam Vitals and nursing note reviewed.  Constitutional:      General: He is not in acute distress.    Appearance: Normal appearance. He is not ill-appearing.  HENT:     Mouth/Throat:     Mouth: Mucous membranes are moist.     Pharynx: Oropharynx is clear.  Cardiovascular:     Rate and Rhythm: Normal rate and regular rhythm.  Pulmonary:     Effort: Pulmonary effort is normal.     Breath sounds: Normal breath sounds.  Neurological:     General: No focal deficit present.     Mental Status: He is alert and oriented to person, place, and time. Mental status is at baseline.  Psychiatric:        Mood and Affect: Mood normal.        Behavior: Behavior normal.        Thought Content: Thought content normal.      MD Evaluation Airway: WNL Heart: WNL Abdomen: WNL Chest/ Lungs: WNL ASA  Classification: 3 Mallampati/Airway Score: Two   Imaging: No results found.  Labs:  CBC: Recent Labs    05/09/21 1218 07/29/21 0919 10/06/21 1244 10/06/21 1247 10/17/21 1322  WBC 10.2 8.3 8.6  --  9.3  HGB 11.5* 12.2* 12.9* 13.2 12.4*  HCT 36.3* 38.4* 40.3 44.6 40.3  PLT 412.0* 367.0 325.0  --  385    COAGS: No results for input(s): "INR", "APTT" in the last 8760 hours.  BMP: Recent Labs    05/09/21 1218 07/29/21 0919 10/06/21 1244 10/17/21 1322  NA 139 140 137 139  K 3.8 3.7 4.0 4.5  CL 103 102 101 109  CO2 27 30 24 24   GLUCOSE 85 105* 87 82  BUN 13 10 13 15   CALCIUM 8.9 9.7 9.3 9.0  CREATININE 1.31 1.30 1.28 1.43*  GFRNONAA  --   --   --  59*    LIVER FUNCTION TESTS: Recent Labs    05/09/21 1218 07/29/21 0919 10/06/21 1244  BILITOT 0.7 0.4 0.6  AST 15 16 17   ALT 17 20 21   ALKPHOS 58 63 64  PROT 7.8 8.3 8.2  ALBUMIN 4.0 4.4 4.4    TUMOR MARKERS: No results for input(s): "AFPTM", "CEA",  "CA199", "CHROMGRNA" in the last 8760 hours.  Assessment and Plan: Patient with past medical history of GERD, HTN, Crohn's presents with complaint of right renal calculi.  IR consulted for right percutaneous nephroureteral stent at the request of Dr. Gloriann Loan. Case reviewed by Dr. Laurence Ferrari who approves patient for procedure.  Patient presents today in their usual state of  health.  He has been NPO and is not currently on blood thinners.  He understands the tube will remain per discretion of Dr. Gloriann Loan.   Risks and benefits of right PCN placement was discussed with the patient including, but not limited to, infection, bleeding, significant bleeding causing loss or decrease in renal function or damage to adjacent structures.   All of the patient's questions were answered, patient is agreeable to proceed.  Consent signed and in chart.   Thank you for this interesting consult.  I greatly enjoyed meeting Berlin Mokry and look forward to participating in their care.  A copy of this report was sent to the requesting provider on this date.  Electronically Signed: Docia Barrier, PA 10/24/2021, 8:39 AM   I spent a total of  30 Minutes   in face to face in clinical consultation, greater than 50% of which was counseling/coordinating care for right renal calculi

## 2021-10-24 ENCOUNTER — Ambulatory Visit (HOSPITAL_COMMUNITY): Payer: No Typology Code available for payment source | Admitting: Physician Assistant

## 2021-10-24 ENCOUNTER — Observation Stay (HOSPITAL_COMMUNITY)
Admission: RE | Admit: 2021-10-24 | Discharge: 2021-10-25 | Disposition: A | Discharge status: Home / Self Care | Payer: No Typology Code available for payment source | Attending: Urology | Admitting: Urology

## 2021-10-24 ENCOUNTER — Other Ambulatory Visit: Payer: Self-pay

## 2021-10-24 ENCOUNTER — Encounter (HOSPITAL_COMMUNITY)
Admission: RE | Disposition: A | Discharge status: Home / Self Care | Payer: Self-pay | Source: Home / Self Care | Attending: Urology

## 2021-10-24 ENCOUNTER — Ambulatory Visit (HOSPITAL_COMMUNITY)
Admission: RE | Admit: 2021-10-24 | Discharge: 2021-10-24 | Disposition: A | Discharge status: Home / Self Care | Payer: No Typology Code available for payment source | Source: Ambulatory Visit | Attending: Urology | Admitting: Urology

## 2021-10-24 ENCOUNTER — Encounter (HOSPITAL_COMMUNITY): Payer: Self-pay | Admitting: Urology

## 2021-10-24 ENCOUNTER — Ambulatory Visit (HOSPITAL_BASED_OUTPATIENT_CLINIC_OR_DEPARTMENT_OTHER): Payer: No Typology Code available for payment source | Admitting: Anesthesiology

## 2021-10-24 ENCOUNTER — Encounter (HOSPITAL_COMMUNITY): Payer: Self-pay

## 2021-10-24 ENCOUNTER — Ambulatory Visit (HOSPITAL_COMMUNITY): Payer: No Typology Code available for payment source

## 2021-10-24 DIAGNOSIS — N200 Calculus of kidney: Secondary | ICD-10-CM

## 2021-10-24 DIAGNOSIS — I10 Essential (primary) hypertension: Secondary | ICD-10-CM | POA: Insufficient documentation

## 2021-10-24 DIAGNOSIS — K5090 Crohn's disease, unspecified, without complications: Secondary | ICD-10-CM | POA: Insufficient documentation

## 2021-10-24 DIAGNOSIS — Z79899 Other long term (current) drug therapy: Secondary | ICD-10-CM | POA: Insufficient documentation

## 2021-10-24 DIAGNOSIS — K219 Gastro-esophageal reflux disease without esophagitis: Secondary | ICD-10-CM | POA: Insufficient documentation

## 2021-10-24 HISTORY — PX: IR URETERAL STENT RIGHT NEW ACCESS W/O SEP NEPHROSTOMY CATH: IMG6076

## 2021-10-24 HISTORY — PX: NEPHROLITHOTOMY: SHX5134

## 2021-10-24 LAB — BASIC METABOLIC PANEL
Anion gap: 10 (ref 5–15)
BUN: 16 mg/dL (ref 6–20)
CO2: 26 mmol/L (ref 22–32)
Calcium: 9 mg/dL (ref 8.9–10.3)
Chloride: 105 mmol/L (ref 98–111)
Creatinine, Ser: 1.69 mg/dL — ABNORMAL HIGH (ref 0.61–1.24)
GFR, Estimated: 49 mL/min — ABNORMAL LOW (ref >60)
Glucose, Bld: 126 mg/dL — ABNORMAL HIGH (ref 70–99)
Potassium: 4 mmol/L (ref 3.5–5.1)
Sodium: 141 mmol/L (ref 135–145)

## 2021-10-24 LAB — HEMOGLOBIN AND HEMATOCRIT, BLOOD
HCT: 40.7 % (ref 39.0–52.0)
Hemoglobin: 12.5 g/dL — ABNORMAL LOW (ref 13.0–17.0)

## 2021-10-24 LAB — TYPE AND SCREEN
ABO/RH(D): A POS
Antibody Screen: NEGATIVE

## 2021-10-24 LAB — PROTIME-INR
INR: 2.1 — ABNORMAL HIGH (ref 0.8–1.2)
Prothrombin Time: 23.2 seconds — ABNORMAL HIGH (ref 11.4–15.2)

## 2021-10-24 LAB — HIV ANTIBODY (ROUTINE TESTING W REFLEX): HIV Screen 4th Generation wRfx: NONREACTIVE

## 2021-10-24 LAB — ABO/RH: ABO/RH(D): A POS

## 2021-10-24 SURGERY — NEPHROLITHOTOMY PERCUTANEOUS
Anesthesia: General | Laterality: Right

## 2021-10-24 MED ORDER — AMLODIPINE-OLMESARTAN 5-40 MG PO TABS: 1.0000 | ORAL_TABLET | Freq: Every day | ORAL | Status: DC

## 2021-10-24 MED ORDER — DIPHENHYDRAMINE HCL 12.5 MG/5ML PO ELIX: 12.5000 mg | ORAL_SOLUTION | Freq: Four times a day (QID) | ORAL | Status: DC | PRN

## 2021-10-24 MED ORDER — ACETAMINOPHEN 500 MG PO TABS
ORAL_TABLET | ORAL | Status: AC
  Administered 2021-10-24: 1000 mg
  Filled 2021-10-24: qty 2

## 2021-10-24 MED ORDER — ONDANSETRON HCL 4 MG/2ML IJ SOLN
INTRAMUSCULAR | Status: DC | PRN
  Administered 2021-10-24: 4 mg via INTRAVENOUS

## 2021-10-24 MED ORDER — SODIUM CHLORIDE 0.9 % IR SOLN
Status: DC | PRN
  Administered 2021-10-24 (×3): 3000 mL
  Administered 2021-10-24: 1000 mL
  Administered 2021-10-24 (×7): 3000 mL

## 2021-10-24 MED ORDER — OXYCODONE HCL 5 MG PO TABS: 5.0000 mg | ORAL_TABLET | ORAL | Status: DC | PRN

## 2021-10-24 MED ORDER — ONDANSETRON HCL 4 MG/2ML IJ SOLN
4.0000 mg | INTRAMUSCULAR | Status: DC | PRN
Start: 2021-10-24 — End: 2021-10-25

## 2021-10-24 MED ORDER — AMISULPRIDE (ANTIEMETIC) 5 MG/2ML IV SOLN
10.0000 mg | Freq: Once | INTRAVENOUS | Status: DC | PRN
Start: 2021-10-24 — End: 2021-10-24

## 2021-10-24 MED ORDER — FENTANYL CITRATE (PF) 100 MCG/2ML IJ SOLN
INTRAMUSCULAR | Status: DC | PRN
  Administered 2021-10-24: 100 ug via INTRAVENOUS

## 2021-10-24 MED ORDER — MIDAZOLAM HCL 2 MG/2ML IJ SOLN
INTRAMUSCULAR | Status: AC
Start: 2021-10-24 — End: ?
  Filled 2021-10-24: qty 2

## 2021-10-24 MED ORDER — SODIUM CHLORIDE 0.9 % IV SOLN: INTRAVENOUS | Status: DC

## 2021-10-24 MED ORDER — LIDOCAINE HCL 1 % IJ SOLN
INTRAMUSCULAR | Status: AC
  Filled 2021-10-24: qty 20

## 2021-10-24 MED ORDER — HYDROCHLOROTHIAZIDE 12.5 MG PO TABS
12.5000 mg | ORAL_TABLET | Freq: Every day | ORAL | Status: DC
  Administered 2021-10-24 – 2021-10-25 (×2): 12.5 mg via ORAL
  Filled 2021-10-24 (×2): qty 1

## 2021-10-24 MED ORDER — FENTANYL CITRATE PF 50 MCG/ML IJ SOSY
25.0000 ug | PREFILLED_SYRINGE | INTRAMUSCULAR | Status: DC | PRN
  Administered 2021-10-24: 50 ug via INTRAVENOUS

## 2021-10-24 MED ORDER — IOHEXOL 300 MG/ML  SOLN
INTRAMUSCULAR | Status: DC | PRN
  Administered 2021-10-24: 23 mL

## 2021-10-24 MED ORDER — FENTANYL CITRATE PF 50 MCG/ML IJ SOSY
PREFILLED_SYRINGE | INTRAMUSCULAR | Status: AC
  Filled 2021-10-24: qty 1

## 2021-10-24 MED ORDER — FENTANYL CITRATE PF 50 MCG/ML IJ SOSY
50.0000 ug | PREFILLED_SYRINGE | Freq: Once | INTRAMUSCULAR | Status: AC
  Administered 2021-10-24: 50 ug via INTRAVENOUS
  Filled 2021-10-24: qty 1

## 2021-10-24 MED ORDER — PROPOFOL 10 MG/ML IV BOLUS
INTRAVENOUS | Status: AC
Start: 2021-10-24 — End: ?
  Filled 2021-10-24: qty 20

## 2021-10-24 MED ORDER — LACTATED RINGERS IV SOLN: INTRAVENOUS | Status: DC | PRN

## 2021-10-24 MED ORDER — HYDROCHLOROTHIAZIDE 12.5 MG PO TABS
12.5000 mg | ORAL_TABLET | Freq: Every day | ORAL | Status: DC
Start: 2021-10-25 — End: 2021-10-24

## 2021-10-24 MED ORDER — IRBESARTAN 300 MG PO TABS
300.0000 mg | ORAL_TABLET | Freq: Every day | ORAL | Status: DC
  Administered 2021-10-25: 300 mg via ORAL
  Filled 2021-10-24: qty 1

## 2021-10-24 MED ORDER — FENTANYL CITRATE (PF) 100 MCG/2ML IJ SOLN
INTRAMUSCULAR | Status: AC | PRN
  Administered 2021-10-24: 50 ug via INTRAVENOUS

## 2021-10-24 MED ORDER — ZOLPIDEM TARTRATE 5 MG PO TABS
5.0000 mg | ORAL_TABLET | Freq: Every evening | ORAL | Status: DC | PRN
  Administered 2021-10-25: 5 mg via ORAL
  Filled 2021-10-24: qty 1

## 2021-10-24 MED ORDER — MIDAZOLAM HCL 2 MG/2ML IJ SOLN
INTRAMUSCULAR | Status: AC | PRN
  Administered 2021-10-24: 1 mg via INTRAVENOUS

## 2021-10-24 MED ORDER — FENTANYL CITRATE (PF) 100 MCG/2ML IJ SOLN
INTRAMUSCULAR | Status: AC
  Filled 2021-10-24: qty 2

## 2021-10-24 MED ORDER — CHLORHEXIDINE GLUCONATE 0.12 % MT SOLN
15.0000 mL | Freq: Once | OROMUCOSAL | Status: AC
  Administered 2021-10-24: 15 mL via OROMUCOSAL
  Filled 2021-10-24: qty 15

## 2021-10-24 MED ORDER — MIDAZOLAM HCL 2 MG/2ML IJ SOLN
INTRAMUSCULAR | Status: AC
  Filled 2021-10-24: qty 4

## 2021-10-24 MED ORDER — PHENOL 1.4 % MT LIQD
1.0000 | OROMUCOSAL | Status: DC | PRN
  Administered 2021-10-24: 1 via OROMUCOSAL
  Filled 2021-10-24: qty 177

## 2021-10-24 MED ORDER — LACTATED RINGERS IV SOLN: INTRAVENOUS | Status: DC

## 2021-10-24 MED ORDER — ORAL CARE MOUTH RINSE: 15.0000 mL | OROMUCOSAL | Status: DC | PRN

## 2021-10-24 MED ORDER — SODIUM CHLORIDE 0.9 % IV SOLN
2.0000 g | INTRAVENOUS | Status: AC
  Administered 2021-10-24: 2 g via INTRAVENOUS
  Filled 2021-10-24: qty 20

## 2021-10-24 MED ORDER — ORAL CARE MOUTH RINSE: 15.0000 mL | Freq: Once | OROMUCOSAL | Status: AC

## 2021-10-24 MED ORDER — FENTANYL CITRATE (PF) 100 MCG/2ML IJ SOLN
INTRAMUSCULAR | Status: AC
  Filled 2021-10-24: qty 4

## 2021-10-24 MED ORDER — OXYCODONE HCL 5 MG PO TABS: 5.0000 mg | ORAL_TABLET | Freq: Once | ORAL | Status: DC | PRN

## 2021-10-24 MED ORDER — DIPHENHYDRAMINE HCL 50 MG/ML IJ SOLN: 12.5000 mg | Freq: Four times a day (QID) | INTRAMUSCULAR | Status: DC | PRN

## 2021-10-24 MED ORDER — PROPOFOL 10 MG/ML IV BOLUS
INTRAVENOUS | Status: DC | PRN
  Administered 2021-10-24: 30 mg via INTRAVENOUS
  Administered 2021-10-24: 170 mg via INTRAVENOUS

## 2021-10-24 MED ORDER — MORPHINE SULFATE (PF) 2 MG/ML IV SOLN: 2.0000 mg | INTRAVENOUS | Status: DC | PRN

## 2021-10-24 MED ORDER — SENNA 8.6 MG PO TABS
1.0000 | ORAL_TABLET | Freq: Two times a day (BID) | ORAL | Status: DC
  Filled 2021-10-24: qty 1

## 2021-10-24 MED ORDER — AMLODIPINE BESYLATE 5 MG PO TABS
5.0000 mg | ORAL_TABLET | Freq: Every day | ORAL | Status: DC
  Administered 2021-10-25: 5 mg via ORAL
  Filled 2021-10-24: qty 1

## 2021-10-24 MED ORDER — LIDOCAINE HCL (CARDIAC) PF 100 MG/5ML IV SOSY
PREFILLED_SYRINGE | INTRAVENOUS | Status: DC | PRN
  Administered 2021-10-24: 100 mg via INTRAVENOUS

## 2021-10-24 MED ORDER — CEFAZOLIN SODIUM-DEXTROSE 2-4 GM/100ML-% IV SOLN
2.0000 g | INTRAVENOUS | Status: AC
  Administered 2021-10-24: 2 g via INTRAVENOUS

## 2021-10-24 MED ORDER — MIDAZOLAM HCL 5 MG/5ML IJ SOLN
INTRAMUSCULAR | Status: DC | PRN
  Administered 2021-10-24: 1 mg via INTRAVENOUS

## 2021-10-24 MED ORDER — CHLORHEXIDINE GLUCONATE CLOTH 2 % EX PADS
6.0000 | MEDICATED_PAD | Freq: Every day | CUTANEOUS | Status: DC
  Administered 2021-10-24 – 2021-10-25 (×2): 6 via TOPICAL

## 2021-10-24 MED ORDER — CEFAZOLIN SODIUM-DEXTROSE 2-4 GM/100ML-% IV SOLN
2.0000 g | Freq: Three times a day (TID) | INTRAVENOUS | Status: AC
  Administered 2021-10-24 – 2021-10-25 (×2): 2 g via INTRAVENOUS
  Filled 2021-10-24 (×2): qty 100

## 2021-10-24 MED ORDER — SUCCINYLCHOLINE CHLORIDE 200 MG/10ML IV SOSY
PREFILLED_SYRINGE | INTRAVENOUS | Status: AC
  Filled 2021-10-24: qty 20

## 2021-10-24 MED ORDER — GLYCOPYRROLATE 0.2 MG/ML IJ SOLN
INTRAMUSCULAR | Status: AC
Start: 2021-10-24 — End: ?
  Filled 2021-10-24: qty 1

## 2021-10-24 MED ORDER — ACETAMINOPHEN 325 MG PO TABS
650.0000 mg | ORAL_TABLET | ORAL | Status: DC | PRN
  Administered 2021-10-25: 650 mg via ORAL
  Filled 2021-10-24: qty 2

## 2021-10-24 MED ORDER — FENTANYL CITRATE PF 50 MCG/ML IJ SOSY
50.0000 ug | PREFILLED_SYRINGE | Freq: Once | INTRAMUSCULAR | Status: AC
  Administered 2021-10-24: 50 ug via INTRAVENOUS

## 2021-10-24 MED ORDER — CEFAZOLIN SODIUM-DEXTROSE 2-4 GM/100ML-% IV SOLN
INTRAVENOUS | Status: AC
  Filled 2021-10-24: qty 100

## 2021-10-24 MED ORDER — OXYBUTYNIN CHLORIDE 5 MG PO TABS
5.0000 mg | ORAL_TABLET | Freq: Three times a day (TID) | ORAL | Status: DC | PRN
  Administered 2021-10-24: 5 mg via ORAL
  Filled 2021-10-24: qty 1

## 2021-10-24 MED ORDER — OXYCODONE HCL 5 MG/5ML PO SOLN: 5.0000 mg | Freq: Once | ORAL | Status: DC | PRN

## 2021-10-24 MED ORDER — SUGAMMADEX SODIUM 200 MG/2ML IV SOLN
INTRAVENOUS | Status: DC | PRN
  Administered 2021-10-24: 200 mg via INTRAVENOUS

## 2021-10-24 MED ORDER — OXYCODONE HCL 5 MG PO TABS
5.0000 mg | ORAL_TABLET | ORAL | 0 refills | Status: DC | PRN
Start: 2021-10-24 — End: 2021-11-21

## 2021-10-24 MED ORDER — ACETAMINOPHEN 500 MG PO TABS
1000.0000 mg | ORAL_TABLET | Freq: Once | ORAL | Status: AC
  Administered 2021-10-24: 1000 mg via ORAL

## 2021-10-24 MED ORDER — IOHEXOL 300 MG/ML  SOLN
50.0000 mL | Freq: Once | INTRAMUSCULAR | Status: AC | PRN
  Administered 2021-10-24: 10 mL

## 2021-10-24 MED ORDER — DOCUSATE SODIUM 100 MG PO CAPS
100.0000 mg | ORAL_CAPSULE | Freq: Two times a day (BID) | ORAL | Status: DC
  Administered 2021-10-25: 100 mg via ORAL
  Filled 2021-10-24: qty 1

## 2021-10-24 MED ORDER — TRIPLE ANTIBIOTIC 3.5-400-5000 EX OINT: 1.0000 | TOPICAL_OINTMENT | Freq: Three times a day (TID) | CUTANEOUS | Status: DC | PRN

## 2021-10-24 MED ORDER — ROCURONIUM BROMIDE 100 MG/10ML IV SOLN
INTRAVENOUS | Status: DC | PRN
  Administered 2021-10-24: 60 mg via INTRAVENOUS

## 2021-10-24 MED ORDER — ONDANSETRON HCL 4 MG/2ML IJ SOLN
INTRAMUSCULAR | Status: AC
  Filled 2021-10-24: qty 2

## 2021-10-24 SURGICAL SUPPLY — 47 items
BAG COUNTER SPONGE SURGICOUNT (BAG) IMPLANT
BAG URINE DRAIN 2000ML AR STRL (UROLOGICAL SUPPLIES) IMPLANT
BASKET ZERO TIP NITINOL 2.4FR (BASKET) IMPLANT
BENZOIN TINCTURE PRP APPL 2/3 (GAUZE/BANDAGES/DRESSINGS) ×1 IMPLANT
BLADE SURG 15 STRL LF DISP TIS (BLADE) ×1 IMPLANT
BLADE SURG 15 STRL SS (BLADE) ×1
CATH FOLEY 2W COUNCIL 20FR 5CC (CATHETERS) IMPLANT
CATH ROBINSON RED A/P 20FR (CATHETERS) IMPLANT
CATH URETERAL DUAL LUMEN 10F (MISCELLANEOUS) ×1 IMPLANT
CATH X-FORCE N30 NEPHROSTOMY (TUBING) ×1 IMPLANT
CHLORAPREP W/TINT 26 (MISCELLANEOUS) ×1 IMPLANT
COVER SURGICAL LIGHT HANDLE (MISCELLANEOUS) IMPLANT
DRAPE C-ARM 42X120 X-RAY (DRAPES) ×1 IMPLANT
DRAPE LINGEMAN PERC (DRAPES) ×1 IMPLANT
DRAPE SURG IRRIG POUCH 19X23 (DRAPES) ×1 IMPLANT
DRSG PAD ABDOMINAL 8X10 ST (GAUZE/BANDAGES/DRESSINGS) ×2 IMPLANT
DRSG TEGADERM 6X8 (GAUZE/BANDAGES/DRESSINGS) IMPLANT
DRSG TEGADERM 8X12 (GAUZE/BANDAGES/DRESSINGS) IMPLANT
GAUZE SPONGE 4X4 12PLY STRL (GAUZE/BANDAGES/DRESSINGS) ×1 IMPLANT
GLOVE BIO SURGEON STRL SZ7.5 (GLOVE) ×1 IMPLANT
GOWN STRL REUS W/ TWL XL LVL3 (GOWN DISPOSABLE) ×1 IMPLANT
GOWN STRL REUS W/TWL XL LVL3 (GOWN DISPOSABLE) ×1
GUIDEWIRE AMPLAZ .035X145 (WIRE) ×2 IMPLANT
KIT BASIN OR (CUSTOM PROCEDURE TRAY) ×1 IMPLANT
KIT PROBE TRILOGY 3.9X350 (MISCELLANEOUS) IMPLANT
LASER FIB FLEXIVA PULSE ID 365 (Laser) IMPLANT
MANIFOLD NEPTUNE II (INSTRUMENTS) ×1 IMPLANT
NS IRRIG 1000ML POUR BTL (IV SOLUTION) ×1 IMPLANT
PACK CYSTO (CUSTOM PROCEDURE TRAY) ×1 IMPLANT
SPONGE T-LAP 4X18 ~~LOC~~+RFID (SPONGE) ×1 IMPLANT
STENT ENDOURETEROTOMY 7-14 26C (STENTS) IMPLANT
STENT URET 6FRX26 CONTOUR (STENTS) IMPLANT
SUT CHROMIC 3 0 SH 27 (SUTURE) IMPLANT
SUT MNCRL AB 4-0 PS2 18 (SUTURE) IMPLANT
SUT SILK 2 0 30  PSL (SUTURE)
SUT SILK 2 0 30 PSL (SUTURE) IMPLANT
SYR 10ML LL (SYRINGE) ×1 IMPLANT
SYR 20ML LL LF (SYRINGE) ×2 IMPLANT
TOWEL OR 17X26 10 PK STRL BLUE (TOWEL DISPOSABLE) ×1 IMPLANT
TOWEL OR NON WOVEN STRL DISP B (DISPOSABLE) ×1 IMPLANT
TRACTIP FLEXIVA PULS ID 200XHI (Laser) IMPLANT
TRACTIP FLEXIVA PULSE ID 200 (Laser)
TRAY FOLEY MTR SLVR 16FR STAT (SET/KITS/TRAYS/PACK) ×1 IMPLANT
TUBING CONNECTING 10 (TUBING) ×2 IMPLANT
TUBING STONE CATCHER TRILOGY (MISCELLANEOUS) IMPLANT
TUBING UROLOGY SET (TUBING) ×1 IMPLANT
WATER STERILE IRR 1000ML POUR (IV SOLUTION) ×1 IMPLANT

## 2021-10-24 NOTE — Transfer of Care (Signed)
Immediate Anesthesia Transfer of Care Note  Patient: Bradley Donaldson  Procedure(s) Performed: RIGHT NEPHROLITHOTOMY PERCUTANEOUS (Right)  Patient Location: PACU  Anesthesia Type:General  Level of Consciousness: awake, alert  and oriented  Airway & Oxygen Therapy: Patient Spontanous Breathing and Patient connected to face mask oxygen  Post-op Assessment: Report given to RN and Post -op Vital signs reviewed and stable  Post vital signs: Reviewed and stable  Last Vitals:  Vitals Value Taken Time  BP 127/95 10/24/21 1419  Temp    Pulse 92 10/24/21 1420  Resp 26 10/24/21 1420  SpO2 100 % 10/24/21 1420  Vitals shown include unvalidated device data.  Last Pain:  Vitals:   10/24/21 1140  TempSrc:   PainSc: 8          Complications: No notable events documented.

## 2021-10-24 NOTE — Plan of Care (Signed)
  Problem: Education: Goal: Knowledge of General Education information will improve Description: Including pain rating scale, medication(s)/side effects and non-pharmacologic comfort measures Outcome: Completed/Met   Problem: Clinical Measurements: Goal: Respiratory complications will improve Outcome: Completed/Met Goal: Cardiovascular complication will be avoided Outcome: Completed/Met   

## 2021-10-24 NOTE — Op Note (Signed)
Operative Note  Preoperative diagnosis:  1.  Right renal calculus  Postoperative diagnosis: 1.  Right renal calculus, 2.5 cm  Procedure(s): 1.  Right percutaneous nephrolithotomy, 2.5 cm  Surgeon: Link Snuffer, MD  Assistants: None  Anesthesia: General  Complications: None immediate  EBL: 50 cc  Specimens: 1.  Renal calculus  Drains/Catheters: 1.  6 x 26 double-J ureteral stent 2.  Foley catheter  Intraoperative findings: Large renal calculus completely removed.  Indication: 51 year old male with a large right renal calculus presents for the previously mentioned operation.  Description of procedure:  The patient was identified and consent was obtained.  The patient was taken to the operating room and placed in the supine position.  The patient was placed under general anesthesia.  Perioperative antibiotics were administered.  The patient was placed in prone position and all pressure points were padded.  Patient was prepped and draped in a standard sterile fashion and a timeout was performed.  A Super Stiff wire was advanced through the nephroureteral stent down to the bladder under fluoroscopic guidance and the nephroureteral stent was removed.  A dual-lumen ureteral catheter was advanced over the Super Stiff wire into the renal pelvis and an antegrade nephrostogram was performed.  This showed a well opacified kidney and a filling defect corresponding to the stone of interest.  I advanced the dual-lumen ureteral catheter into the proximal ureter under fluoroscopic guidance followed by placement of a second Super Stiff wire down to the bladder under fluoroscopic guidance.  The dual-lumen catheter was removed.  An incision was made alongside the wires.  The balloon dilator was then advanced over one of the wires and into the renal pelvis fluoroscopic guidance and the tract was dilated to a pressure of 18.  The sheath was advanced over the balloon and into the renal pelvis.  The  balloon was withdrawn keeping the sheath in place.  The nephroscope was advanced into the kidney and the sheath was outside of the kidney.  I withdrew the scope.  I readvanced the dual-lumen ureteral catheter over the wire and shot a nephrostogram.  This confirmed that the sheath was not quite advanced into the collecting system.  I advanced the balloon dilator over the wire under continuous fluoroscopic guidance and reinflated the balloon and advanced the sheath a couple of centimeters further into the collecting system.  Balloon was deflated and withdrawn.  Nephroscope was advanced into the sheath and into the kidney.  The stone of interest was encountered.  The stone was then removed with a combination of pneumatic and ultrasound with suction as well as extraction.  Flexible cystoscope was used to perform a complete pyeloscopy.  A couple of stones were basket extracted. All stone was removed and there was no evidence of any other stones within the kidney.    A 6 x 26double-J ureteral stent was advanced over 1 of the wires under fluoroscopic guidance and the wire was withdrawn.  Fluoroscopy confirmed a good coil within the bladder as well as a good coil in the renal pelvis proximally.  I also inspected the kidney with the nephroscope and this confirmed good positioning of the stent.  The other wire was removed.  Her ostomy again confirmed appropriate placement of the stent and direct visualization confirmed a good coil within the kidney.  There was a good coil in the bladder on fluoroscopy.  Sheath was withdrawn.  Incision was closed with running 4-0 Monocryl. The patient tolerated procedure well and was stable postoperatively.  Plan: Patient will remain under observation overnight. Stent to be removed in 1 week or so.

## 2021-10-24 NOTE — Anesthesia Procedure Notes (Signed)
Procedure Name: Intubation Date/Time: 10/24/2021 12:22 PM  Performed by: [place] Manus Rudd, CRNAPre-anesthesia Checklist: Patient identified, Emergency Drugs available, Suction available and Patient being monitored Patient Re-evaluated:Patient Re-evaluated prior to induction Oxygen Delivery Method: Circle system utilized Preoxygenation: Pre-oxygenation with 100% oxygen Induction Type: IV induction Ventilation: Mask ventilation without difficulty Laryngoscope Size: Mac and 4 Grade View: Grade I Tube type: Oral Tube size: 7.5 mm Number of attempts: 1 Airway Equipment and Method: Stylet and Oral airway Placement Confirmation: ETT inserted through vocal cords under direct vision, positive ETCO2 and breath sounds checked- equal and bilateral Secured at: 22 cm Tube secured with: Tape Dental Injury: Teeth and Oropharynx as per pre-operative assessment

## 2021-10-24 NOTE — Discharge Instructions (Signed)
Discharge instructions following PCNL ° °Call your doctor for: °Fevers greater than 100.5 °Severe nausea or vomiting °Increasing pain not controlled by pain medication °Increasing redness or drainage from incisions °Decreased urine output or a catheter is no longer draining ° °The number for questions is [phone]. ° °Activity: °Gradually increase activity with short frequent walks, 3-4 times a day.  Avoid strenuous activities, like sports, lawn-mowing, or heavy lifting (more than 10-15 pounds).  Wear loose, comfortable clothing that pull or kink the tube or tubes.  Do not drive while taking pain medication, or until your doctor permitts it. ° °Bathing and dressing changes: °You should not shower for 48 hours after surgery.  Do not soak your back in a bathtub. ° °Drainage bag care: °You may be discharged with a drainage bag around the site of your surgery.  The drainage bag should be secured such that it never pulls or loosens to prevent it from leaking.  It is important to wash her hands before and after emptying the drainage bag to help prevent the spread of infection.  The drainage bag should be emptied as needed.  When the wound stops draining or it is manageable with a dry gauze dressing, you can remove the bag. ° °If your tube in the back was removed, you should expect to have some leakage of fluid from the back incision.  This should slowly decrease and stop over the next couple of days.  If you have severe pain or persistent leakage, please call the number above.  Otherwise, your dressing can be changed 1-2 times daily or more if needed. ° °Diet: °It is extremely important to drink plenty of fluids after surgery, especially water.  You may resume your regular diet, unless otherwise instructed. ° °Medications: °May take Tylenol (acetaminophen) or ibuprofen (Advil, Motrin) as directed over-the-counter. °Take any prescriptions as directed. ° °Follow-up appointments: °Follow-up appointment will be scheduled  with Dr. Garret Teale °

## 2021-10-24 NOTE — Procedures (Signed)
Interventional Radiology Procedure Note  Procedure: Successful placement of a RIGHT sided percutaneous nephroureteral catheter for PCNL.  Interpolar access obtained at the superior aspect of the staghorn calculus.  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   Complications: None  Estimated Blood Loss: None  Recommendations: To OR for PCNL as planned.    Signed,  Criselda Peaches, MD

## 2021-10-24 NOTE — Anesthesia Postprocedure Evaluation (Signed)
Anesthesia Post Note  Patient: Bradley Donaldson  Procedure(s) Performed: RIGHT NEPHROLITHOTOMY PERCUTANEOUS (Right)     Patient location during evaluation: PACU Anesthesia Type: General Level of consciousness: awake and alert Pain management: pain level controlled Vital Signs Assessment: post-procedure vital signs reviewed and stable Respiratory status: spontaneous breathing, nonlabored ventilation and respiratory function stable Cardiovascular status: blood pressure returned to baseline Postop Assessment: no apparent nausea or vomiting Anesthetic complications: no   No notable events documented.  Last Vitals:  Vitals:   10/24/21 1451 10/24/21 1500  BP:  (!) 154/96  Pulse: 72 78  Resp: 18 18  Temp:    SpO2: 100% 100%    Last Pain:  Vitals:   10/24/21 1500  TempSrc:   PainSc: Asleep                 Marthenia Rolling

## 2021-10-24 NOTE — H&P (Signed)
CC/HPI: CC: Right renal calculi  HPI:  09/24/2021  51 year old male underwent CT scan of the abdomen and pelvis on 09/03/2021 for a history of Crohn's disease. This showed a large right staghorn calculus measuring 2.5 cm. He also had some simple cyst. No concerning renal masses. He denies any flank pain or hematuria dysuria. Does have microscopic hematuria today. Denies any stone events in the past. States he has had microscopic hematuria in the past. He does also complain about some mild erectile dysfunction. Oral PDE 5 inhibitors do sometimes give him side effects but he wants to try tadalafil again. This seemed to work the best for him. He denies nitrates/nitroglycerin.     ALLERGIES: Zantac    MEDICATIONS: Hydrochlorothiazide  Azor 10 mg-40 mg tablet Oral  Cyanocobalamin     GU PSH: None     PSH Notes: Colon Surgery   NON-GU PSH: None   GU PMH: ED due to arterial insufficiency, Erectile dysfunction due to arterial insufficiency - 2017 Renal calculus, Renal calculus, bilateral - 2017 Abdominal Pain Unspec, Left flank pain - 2017 LUQ pain, Intermittent left upper quadrant abdominal pain - 2017    NON-GU PMH: Encounter for general adult medical examination without abnormal findings, Encounter for preventive health examination - 2017 Personal history of other diseases of the circulatory system, History of hypertension - 2017 Personal history of other diseases of the nervous system and sense organs, History of sleep apnea - 2017 Hypertension Sleep Apnea    FAMILY HISTORY: 2 daughters - Other 1 son - Other Hypertension - Runs In Family   SOCIAL HISTORY: Marital Status: Married Preferred Language: English; Race: Black or African American Current Smoking Status: Patient has never smoked.   Tobacco Use Assessment Completed: Used Tobacco in last 30 days? Drinks 2 caffeinated drinks per day.     Notes: Married, Alcohol use, Never a smoker, Caffeine use   REVIEW OF SYSTEMS:    GU  Review Male:   Patient reports frequent urination and get up at night to urinate. Patient denies hard to postpone urination, burning/ pain with urination, leakage of urine, stream starts and stops, trouble starting your stream, have to strain to urinate , erection problems, and penile pain.  Gastrointestinal (Upper):   Patient denies nausea, vomiting, and indigestion/ heartburn.  Gastrointestinal (Lower):   Patient denies diarrhea and constipation.  Constitutional:   Patient denies fever, night sweats, weight loss, and fatigue.  Skin:   Patient denies skin rash/ lesion and itching.  Eyes:   Patient denies blurred vision and double vision.  Ears/ Nose/ Throat:   Patient denies sore throat and sinus problems.  Hematologic/Lymphatic:   Patient denies swollen glands and easy bruising.  Cardiovascular:   Patient denies leg swelling and chest pains.  Respiratory:   Patient denies cough and shortness of breath.  Endocrine:   Patient denies excessive thirst.  Musculoskeletal:   Patient denies back pain and joint pain.  Neurological:   Patient denies headaches and dizziness.  Psychologic:   Patient denies depression and anxiety.   VITAL SIGNS:      09/24/2021 02:41 PM  Weight 184 lb / 83.46 kg  Height 63 in / 160.02 cm  BP 144/87 mmHg  Heart Rate 99 /min  Temperature 97.7 F / 36.5 C  BMI 32.6 kg/m   MULTI-SYSTEM PHYSICAL EXAMINATION:    Constitutional: Well-nourished. No physical deformities. Normally developed. Good grooming.  Respiratory: No labored breathing, no use of accessory muscles.   Cardiovascular: Normal temperature, normal extremity  pulses, no swelling, no varicosities.  Skin: No paleness, no jaundice, no cyanosis. No lesion, no ulcer, no rash.  Neurologic / Psychiatric: Oriented to time, oriented to place, oriented to person. No depression, no anxiety, no agitation.  Gastrointestinal: No mass, no tenderness, no rigidity, non obese abdomen. No CVA tenderness bilaterally  Eyes:  Normal conjunctivae. Normal eyelids.  Musculoskeletal: Normal gait and station of head and neck.     Complexity of Data:  Source Of History:  Patient  Records Review:   Previous Doctor Records, Previous Patient Records  Urine Test Review:   Urinalysis  X-Ray Review: C.T. Abdomen/Pelvis: Reviewed Films. Reviewed Report. Discussed With Patient.     PROCEDURES:          Urinalysis w/Scope Dipstick Dipstick Cont'd Micro  Color: Yellow Bilirubin: Neg mg/dL WBC/hpf: 0 - 5/hpf  Appearance: Slightly Cloudy Ketones: Neg mg/dL RBC/hpf: >60/hpf  Specific Gravity: 1.025 Blood: 3+ ery/uL Bacteria: Mod (26-50/hpf)  pH: <=5.0 Protein: Neg mg/dL Cystals: NS (Not Seen)  Glucose: Neg mg/dL Urobilinogen: 0.2 mg/dL Casts: NS (Not Seen)    Nitrites: Neg Trichomonas: Not Present    Leukocyte Esterase: Neg leu/uL Mucous: Present      Epithelial Cells: 0 - 5/hpf      Yeast: NS (Not Seen)      Sperm: Not Present    ASSESSMENT:      ICD-10 Details  1 GU:   ED due to arterial insufficiency - N52.01 Undiagnosed New Problem  2   Microscopic hematuria - R31.21 Undiagnosed New Problem  3   Renal calculus - N20.0 Undiagnosed New Problem   PLAN:            Medications New Meds: Tadalafil 5 mg tablet 1 tablet PO Daily   #30  6 Refill(s)  Pharmacy Name:  [id number]  Address:  [address] [place]   [place], [place] [address]  Phone:  [phone]  Fax:  [phone]            Orders Labs Urine Culture          Document Letter(s):  Created for Patient: Clinical Summary         Notes:   Trial of tadalafil for erectile dysfunction. He denies nitrates/nitroglycerin.   We discussed the management of urinary stones. These options include observation, ureteroscopy, shockwave lithotripsy, and PCNL. We discussed which options are relevant to these particular stones. We discussed the natural history of stones as well as the complications of untreated stones and the impact on  quality of life without treatment as well as with each of the above listed treatments. We also discussed the efficacy of each treatment in its ability to clear the stone burden. With any of these management options I discussed the signs and symptoms of infection and the need for emergent treatment should these be experienced. For each option we discussed the ability of each procedure to clear the patient of their stone burden.   For observation I described the risks which include but are not limited to silent renal damage, life-threatening infection, need for emergent surgery, failure to pass stone, and pain.   For ureteroscopy I described the risks which include heart attack, stroke, pulmonary embolus, death, bleeding, infection, damage to contiguous structures, positioning injury, ureteral stricture, ureteral avulsion, ureteral injury, need for ureteral stent, inability to perform ureteroscopy, need for an interval procedure, inability to clear stone burden, stent discomfort and pain.   For shockwave lithotripsy I described the risks  which include arrhythmia, kidney contusion, kidney hemorrhage, need for transfusion, pain, inability to break up stone, inability to pass stone fragments, Steinstrasse, infection associated with obstructing stones, need for different surgical procedure, need for repeat shockwave lithotripsy.   For PCNL I described the risks including heart attack, pulmonary embolus, death, positioning injury, pneumothorax, hydrothorax, need for chest tube, inability to clear stone burden, renal laceration, arterial venous fistula or malformation, need for embolization of kidney, loss of kidney or renal function, need for repeat procedure, need for prolonged nephrostomy tube, ureteral avulsion.   We will also obtain cystoscopy given his red blood cell greater than 60 per high-power field.   He would like to proceed with PCNL.    Signed by Link Snuffer, III, M.D. on 09/24/21 at 3:03 PM  (EDT

## 2021-10-25 ENCOUNTER — Encounter (HOSPITAL_COMMUNITY): Payer: Self-pay | Admitting: Urology

## 2021-10-25 DIAGNOSIS — N200 Calculus of kidney: Secondary | ICD-10-CM | POA: Diagnosis not present

## 2021-10-25 LAB — HEMOGLOBIN AND HEMATOCRIT, BLOOD
HCT: 36.2 % — ABNORMAL LOW (ref 39.0–52.0)
Hemoglobin: 11.1 g/dL — ABNORMAL LOW (ref 13.0–17.0)

## 2021-10-25 LAB — BASIC METABOLIC PANEL
Anion gap: 5 (ref 5–15)
BUN: 15 mg/dL (ref 6–20)
CO2: 27 mmol/L (ref 22–32)
Calcium: 8.6 mg/dL — ABNORMAL LOW (ref 8.9–10.3)
Chloride: 107 mmol/L (ref 98–111)
Creatinine, Ser: 1.59 mg/dL — ABNORMAL HIGH (ref 0.61–1.24)
GFR, Estimated: 52 mL/min — ABNORMAL LOW (ref >60)
Glucose, Bld: 99 mg/dL (ref 70–99)
Potassium: 3.7 mmol/L (ref 3.5–5.1)
Sodium: 139 mmol/L (ref 135–145)

## 2021-10-25 NOTE — Progress Notes (Signed)
  Transition of Care [facility]) Screening Note   Patient Details  Name: Bradley Donaldson Date of Birth: [birth year]   Transition of Care [facility]) CM/SW Contact:    Vassie Moselle, LCSW Phone Number: 10/25/2021, 12:52 PM    Transition of Care Department [facility]) has reviewed patient and no TOC needs have been identified at this time. We will continue to monitor patient advancement through interdisciplinary progression rounds. If new patient transition needs arise, please place a TOC consult.

## 2021-10-25 NOTE — Plan of Care (Signed)
  Problem: Health Behavior/Discharge Planning: Goal: Ability to manage health-related needs will improve Outcome: Adequate for Discharge   Problem: Clinical Measurements: Goal: Ability to maintain clinical measurements within normal limits will improve Outcome: Adequate for Discharge Goal: Will remain free from infection Outcome: Adequate for Discharge Goal: Diagnostic test results will improve Outcome: Adequate for Discharge   Problem: Activity: Goal: Risk for activity intolerance will decrease Outcome: Adequate for Discharge   Problem: Nutrition: Goal: Adequate nutrition will be maintained Outcome: Adequate for Discharge   Problem: Coping: Goal: Level of anxiety will decrease Outcome: Adequate for Discharge   Problem: Elimination: Goal: Will not experience complications related to bowel motility Outcome: Adequate for Discharge Goal: Will not experience complications related to urinary retention Outcome: Adequate for Discharge   Problem: Pain Managment: Goal: General experience of comfort will improve Outcome: Adequate for Discharge   Problem: Safety: Goal: Ability to remain free from injury will improve Outcome: Adequate for Discharge   Problem: Skin Integrity: Goal: Risk for impaired skin integrity will decrease Outcome: Adequate for Discharge   Problem: Clinical Measurements: Goal: Postoperative complications will be avoided or minimized Outcome: Adequate for Discharge   Problem: Skin Integrity: Goal: Demonstration of wound healing without infection will improve Outcome: Adequate for Discharge

## 2021-10-30 NOTE — Discharge Summary (Signed)
Physician Discharge Summary  Patient ID: Bradley Donaldson MRN: [id number] DOB/AGE: 1970-11-14 51 y.o.  Admit date: 10/24/2021 Discharge date: 10/25/2021  Admission Diagnoses:  Renal calculi  Discharge Diagnoses:  Principal Problem:   Renal calculi   Past Medical History:  Diagnosis Date   ALLERGIC RHINITIS 03/18/2007   ANEMIA-IRON DEFICIENCY 03/18/2007   hx of   Anxiety    work related stress-   CROHN'S DISEASE 01/04/2007   DERMATITIS, SCALP 12/09/2007   GERD (gastroesophageal reflux disease)    OTC meds PRN   History of kidney stones    HYPERTENSION 01/04/2007   on meds   Incisional hernia 06/18/2010   Seasonal allergies    Sleep apnea    uses CPAP   URI 12/09/2007    Surgeries: Procedure(s): RIGHT NEPHROLITHOTOMY PERCUTANEOUS on 10/24/2021   Consultants (if any):   Discharged Condition: Improved  Hospital Course: Bradley Donaldson is an 51 y.o. male who was admitted 10/24/2021 with a diagnosis of Renal calculi and went to the operating room on 10/24/2021 and underwent the above named procedures.    He was given perioperative antibiotics:  Anti-infectives (From admission, onward)    Start     Dose/Rate Route Frequency Ordered Stop   10/24/21 2200  ceFAZolin (ANCEF) IVPB 2g/100 mL premix        2 g 200 mL/hr over 30 Minutes Intravenous Every 8 hours 10/24/21 1548 10/25/21 0645   10/24/21 0813  ceFAZolin (ANCEF) 2-4 GM/100ML-% IVPB       Note to Pharmacy: Guadelupe Sabin B: cabinet override      10/24/21 0813 10/24/21 1246     .  He was given sequential compression devices, early ambulation,  for DVT prophylaxis.  He benefited maximally from the hospital stay and there were no complications.    Recent vital signs:  Vitals:   10/25/21 0535 10/25/21 0901  BP: 121/70 136/84  Pulse: 87 88  Resp: 18 16  Temp: 98.4 F (36.9 C) 98.3 F (36.8 C)  SpO2: 99% 96%    Recent laboratory studies:  Lab Results  Component Value Date   HGB 11.1 (L) 10/25/2021   HGB 12.5  (L) 10/24/2021   HGB 12.4 (L) 10/17/2021   Lab Results  Component Value Date   WBC 9.3 10/17/2021   PLT 385 10/17/2021   Lab Results  Component Value Date   INR 2.1 (H) 10/24/2021   Lab Results  Component Value Date   NA 139 10/25/2021   K 3.7 10/25/2021   CL 107 10/25/2021   CO2 27 10/25/2021   BUN 15 10/25/2021   CREATININE 1.59 (H) 10/25/2021   GLUCOSE 99 10/25/2021    Discharge Medications:   Allergies as of 10/25/2021       Reactions   Ranitidine Hcl Itching, Rash   Full body        Medication List     TAKE these medications    amLODipine-olmesartan 5-40 MG tablet Commonly known as: AZOR Take 1 tablet by mouth once daily   cyanocobalamin 1000 MCG/ML injection Commonly known as: VITAMIN B12 Inject 1 mL (1,000 mcg total) into the muscle every 30 (thirty) days.   Fluocinolone Acetonide Scalp 0.01 % Oil Apply 1 Application topically every 7 (seven) days.   hydrochlorothiazide 25 MG tablet Commonly known as: HYDRODIURIL Take 1/2 (one-half) tablet by mouth once daily   omeprazole 20 MG capsule Commonly known as: PRILOSEC Take 1 capsule (20 mg total) by mouth daily.   oxyCODONE 5 MG immediate release tablet  Commonly known as: Oxy IR/ROXICODONE Take 1 tablet (5 mg total) by mouth every 4 (four) hours as needed for severe pain.   tacrolimus 0.03 % ointment Commonly known as: PROTOPIC Apply 1 Application topically daily.   triamcinolone 0.025 % ointment Commonly known as: KENALOG Apply 1 Application topically daily.        Diagnostic Studies: DG C-Arm 1-60 Min-No Report  Result Date: 10/24/2021 Fluoroscopy was utilized by the requesting physician.  No radiographic interpretation.   DG C-Arm 1-60 Min-No Report  Result Date: 10/24/2021 Fluoroscopy was utilized by the requesting physician.  No radiographic interpretation.   IR URETERAL STENT RIGHT NEW ACCESS W/O SEP NEPHROSTOMY CATH  Result Date: 10/24/2021 INDICATION: Renal stones, access for  right percutaneous nephrolithotomy. EXAM: 1. Percutaneous puncture of the renal collecting system under fluoroscopic guidance 2. Placement of a percutaneous nephrostomy tube Operating Physician:  Criselda Peaches, MD COMPARISON:  None Available. MEDICATIONS: 2 g Rocephin; The antibiotic was administered in an appropriate time frame prior to skin puncture. ANESTHESIA/SEDATION: Fentanyl 100 mcg IV; Versed 2 mg IV Moderate Sedation Time:  16 minutes The patient's vital signs and level of consciousness were continuously monitored during the procedure by the interventional radiology nurse under my direct supervision. CONTRAST:  71m OMNIPAQUE IOHEXOL 300 MG/ML SOLN - administered into the collecting system(s) FLUOROSCOPY: Radiation exposure index: 84 mGy reference air kerma COMPLICATIONS: None immediate. PROCEDURE: Informed written consent was obtained from the patient after a thorough discussion of the procedural risks, benefits and alternatives. All questions were addressed. Maximal Sterile Barrier Technique was utilized including caps, mask, sterile gowns, sterile gloves, sterile drape, hand hygiene and skin antiseptic. A timeout was performed prior to the initiation of the procedure. A pre procedural spot fluoroscopic image was obtained of the upper abdomen. The stone within superior aspect of the interpolar collecting system was targeted fluoroscopically with a 21 gauge trocar needle. Access to the collecting system was confirmed with contrast injection. The needle was exchanged for an Accustick set which was utilized to dilate the tract and was subsequently exchanged for a Kumpe catheter over a Bentson wire. The Kumpe catheter was advanced down the ureter and into the urinary bladder. Postprocedural spot radiographs were obtained in various obliquities and the catheter was sutured to the skin. The catheter was capped and a dressing was placed. The patient tolerated the procedure well without immediate  postprocedural complication. IMPRESSION: Successful fluoroscopic guided right percutaneous nephrostomy with placement of a 5 French Kumpe catheter to the level of the urinary bladder to be utilized during impending nephrolithotomy procedure. Access is into the superior aspect of the staghorn calculus in the interpolar collecting system. 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 Electronically Signed   By: HJacqulynn CadetM.D.   On: 10/24/2021 12:19    Disposition: Discharge disposition: 01-Home or Self Care            Signed: PNicolette Bang8/24/2023, 11:50 AM

## 2021-11-13 ENCOUNTER — Telehealth: Payer: Self-pay | Admitting: Gastroenterology

## 2021-11-13 ENCOUNTER — Other Ambulatory Visit: Payer: Self-pay

## 2021-11-13 DIAGNOSIS — R197 Diarrhea, unspecified: Secondary | ICD-10-CM

## 2021-11-13 DIAGNOSIS — R109 Unspecified abdominal pain: Secondary | ICD-10-CM

## 2021-11-13 DIAGNOSIS — R140 Abdominal distension (gaseous): Secondary | ICD-10-CM

## 2021-11-13 NOTE — Telephone Encounter (Signed)
Plan for the following: - Check CBC, ESR, CRP, fecal calprotectin, GI PCR panel, C. difficile - Start probiotic

## 2021-11-13 NOTE — Telephone Encounter (Signed)
Inbound call from patient stating he has been having diarrhea since previous surgery. Patient is inquiring of any medication that can be prescribed to him. If so verifying pharmacy is [facility] nurse on [address]. Please give patient a call back to further advise.  Thank you

## 2021-11-13 NOTE — Progress Notes (Signed)
cbc

## 2021-11-13 NOTE — Telephone Encounter (Signed)
Dr. Lyndel Safe pt  Pt stated that he recently had surgery done on on 10/24/2021 where he had kidney stones removed and since then he has been having diarrhea along with abdominal spasms, bloating and gas: Chart reviewed: Pt had a Right Nephrolithotomy Percutaneous: Pt stated that he is averaging about 4-5 loose stools a day: Pt was questioned did he receive antibiotics after surgery: Pt stated that he only took an antibiotic directly after surgery for 1 day:  Please advise as DOD

## 2021-11-13 NOTE — Telephone Encounter (Signed)
Pt was made aware of Dr. Bryan Lemma recommendations: Orders for labs placed in Epic: Pt made aware: Location to lab given: Pt verbalized understanding with all questions answered.

## 2021-11-14 ENCOUNTER — Other Ambulatory Visit (INDEPENDENT_AMBULATORY_CARE_PROVIDER_SITE_OTHER): Payer: No Typology Code available for payment source

## 2021-11-14 DIAGNOSIS — R140 Abdominal distension (gaseous): Secondary | ICD-10-CM | POA: Diagnosis not present

## 2021-11-14 DIAGNOSIS — R109 Unspecified abdominal pain: Secondary | ICD-10-CM | POA: Diagnosis not present

## 2021-11-14 DIAGNOSIS — R197 Diarrhea, unspecified: Secondary | ICD-10-CM

## 2021-11-14 LAB — CBC WITH DIFFERENTIAL/PLATELET
Basophils Absolute: 0.1 10*3/uL (ref 0.0–0.1)
Basophils Relative: 0.5 % (ref 0.0–3.0)
Eosinophils Absolute: 0.2 10*3/uL (ref 0.0–0.7)
Eosinophils Relative: 1.5 % (ref 0.0–5.0)
HCT: 40.2 % (ref 39.0–52.0)
Hemoglobin: 12.6 g/dL — ABNORMAL LOW (ref 13.0–17.0)
Lymphocytes Relative: 28.7 % (ref 12.0–46.0)
Lymphs Abs: 3 10*3/uL (ref 0.7–4.0)
MCHC: 31.4 g/dL (ref 30.0–36.0)
MCV: 68.4 fl — ABNORMAL LOW (ref 78.0–100.0)
Monocytes Absolute: 1.1 10*3/uL — ABNORMAL HIGH (ref 0.1–1.0)
Monocytes Relative: 10.5 % (ref 3.0–12.0)
Neutro Abs: 6.2 10*3/uL (ref 1.4–7.7)
Neutrophils Relative %: 58.8 % (ref 43.0–77.0)
Platelets: 438 10*3/uL — ABNORMAL HIGH (ref 150.0–400.0)
RBC: 5.87 Mil/uL — ABNORMAL HIGH (ref 4.22–5.81)
RDW: 16.9 % — ABNORMAL HIGH (ref 11.5–15.5)
WBC: 10.5 10*3/uL (ref 4.0–10.5)

## 2021-11-14 LAB — SEDIMENTATION RATE: Sed Rate: 36 mm/hr — ABNORMAL HIGH (ref 0–20)

## 2021-11-14 LAB — C-REACTIVE PROTEIN: CRP: 3.1 mg/dL (ref 0.5–20.0)

## 2021-11-17 ENCOUNTER — Other Ambulatory Visit: Payer: No Typology Code available for payment source

## 2021-11-17 DIAGNOSIS — R140 Abdominal distension (gaseous): Secondary | ICD-10-CM

## 2021-11-17 DIAGNOSIS — R197 Diarrhea, unspecified: Secondary | ICD-10-CM

## 2021-11-17 DIAGNOSIS — R109 Unspecified abdominal pain: Secondary | ICD-10-CM

## 2021-11-19 ENCOUNTER — Other Ambulatory Visit: Payer: Self-pay

## 2021-11-19 DIAGNOSIS — A0472 Enterocolitis due to Clostridium difficile, not specified as recurrent: Secondary | ICD-10-CM

## 2021-11-19 LAB — GI PROFILE, STOOL, PCR

## 2021-11-19 LAB — CALPROTECTIN, FECAL: Calprotectin, Fecal: 544 ug/g — ABNORMAL HIGH (ref 0–120)

## 2021-11-19 LAB — CLOSTRIDIUM DIFFICILE BY PCR: Toxigenic C. Difficile by PCR: POSITIVE — AB

## 2021-11-19 MED ORDER — VANCOMYCIN HCL 125 MG PO CAPS: 125.0000 mg | ORAL_CAPSULE | Freq: Four times a day (QID) | ORAL | 0 refills | Status: AC

## 2021-11-19 NOTE — Progress Notes (Signed)
07/17/21- 57 yoM never smoker for sleep evaluation with concern of OSA Medical problem list includes OSA, HTN, Allergic Rhinitis, GERD, Goiter, Rash, Anxiety, Regional Enteritis,  HST 2012- AHI 25/ hr Epworth score-9 Body weight today-190 Covid vax-2 Phizer, 1 Moderna Flu vax-had CPAP/ [facility] Life has been better with CPAP but in the last year he is noticing more daytime tiredness.  CPAP still prevents snoring.  No ENT surgery heart or lung disease recognized.  Takes no sleep medicines and little caffeine.  Works from home with no nighttime work or call in.  He has continued using his old CPAP machine every night.  11/21/21- 72 yoM never smoker followed for OSA, complicated by OSA, HTN, Allergic Rhinitis, GERD, Goiter, Rash, Anxiety, Crohns Regional Enteritis, Kidney Stone,  CPAP auto 5-20/ [employer]  replacement ordered 07/17/21 Download compliance- 93%, AHI 2.4/ hr Body weight today-183 lbs Covid vax- Flu vax-declines Replacement CPAP machine is about 2 months old and working well.  Used every night.  Download reviewed.  Life is better off with CPAP. Discussed recent experience with kidney stone.  ROS-see HPI   + = positive Constitutional:    weight loss, night sweats, fevers, chills, fatigue, lassitude. HEENT:    headaches, difficulty swallowing, tooth/dental problems, sore throat,       sneezing, itching, ear ache, nasal congestion, post nasal drip, snoring CV:    chest pain, orthopnea, PND, swelling in lower extremities, anasarca,                                  dizziness, palpitations Resp:   shortness of breath with exertion or at rest.                productive cough,   non-productive cough, coughing up of blood.              change in color of mucus.  wheezing.   Skin:    rash or lesions. GI:  No-   heartburn, indigestion, abdominal pain, nausea, vomiting, diarrhea,                 change in bowel habits, loss of appetite GU: dysuria, change in color of urine, no urgency or frequency.    flank pain. MS:   joint pain, stiffness, decreased range of motion, back pain. Neuro-     nothing unusual Psych:  change in mood or affect.  depression or anxiety.   memory loss.   OBJ- Physical Exam General- Alert, Oriented, Affect-appropriate, Distress- none acute, +obese Skin- rash-none, lesions- none, excoriation- none Lymphadenopathy- none Head- atraumatic            Eyes- Gross vision intact, PERRLA, conjunctivae and secretions clear            Ears- Hearing, canals-normal            Nose- Clear, no-Septal dev, mucus, polyps, erosion, perforation             Throat- Mallampati IV , mucosa clear , drainage- none, tonsils- atrophic, +teeth Neck- flexible , trachea midline, no stridor , thyroid nl, carotid no bruit Chest - symmetrical excursion , unlabored           Heart/CV- RRR , no murmur , no gallop  , no rub, nl s1 s2                           -  JVD- none , edema- none, stasis changes- none, varices- none           Lung- clear to P&A, wheeze- none, cough- none , dullness-none, rub- none           Chest wall-  Abd-  Br/ Gen/ Rectal- Not done, not indicated Extrem- cyanosis- none, clubbing, none, atrophy- none, strength- nl Neuro- grossly intact to observation

## 2021-11-21 ENCOUNTER — Ambulatory Visit (INDEPENDENT_AMBULATORY_CARE_PROVIDER_SITE_OTHER): Payer: No Typology Code available for payment source | Admitting: Internal Medicine

## 2021-11-21 ENCOUNTER — Encounter: Payer: Self-pay | Admitting: Internal Medicine

## 2021-11-21 DIAGNOSIS — N200 Calculus of kidney: Secondary | ICD-10-CM

## 2021-11-21 DIAGNOSIS — G4733 Obstructive sleep apnea (adult) (pediatric): Secondary | ICD-10-CM

## 2021-11-21 NOTE — Assessment & Plan Note (Signed)
Benefits from CPAP.  Good compliance and control. Plan-continue auto 5-20

## 2021-11-21 NOTE — Assessment & Plan Note (Signed)
Recently resolved.  Experience reviewed.

## 2021-11-21 NOTE — Patient Instructions (Signed)
We can continue CPAP auto 5-20  Please call if we can help

## 2022-01-27 ENCOUNTER — Encounter: Payer: Self-pay | Admitting: Internal Medicine

## 2022-02-20 MED ORDER — AMLODIPINE-OLMESARTAN 10-40 MG PO TABS: 1.0000 | ORAL_TABLET | Freq: Every day | ORAL | 3 refills | Status: DC

## 2022-02-20 NOTE — Telephone Encounter (Signed)
Ok to increase the azor to the 10 - 40 mg (from the 5 - 40 mg) - done erx, and to follow BP at home as he has, and let us know if still elevated in 2-3 wks

## 2022-07-10 ENCOUNTER — Encounter (HOSPITAL_COMMUNITY): Payer: Self-pay

## 2022-07-10 ENCOUNTER — Ambulatory Visit (HOSPITAL_COMMUNITY)
Admission: EM | Admit: 2022-07-10 | Discharge: 2022-07-10 | Disposition: A | Discharge status: Home / Self Care | Payer: BC Managed Care – PPO | Attending: Physician Assistant | Admitting: Physician Assistant

## 2022-07-10 DIAGNOSIS — R079 Chest pain, unspecified: Secondary | ICD-10-CM

## 2022-07-10 MED ORDER — ALUM & MAG HYDROXIDE-SIMETH 200-200-20 MG/5ML PO SUSP
ORAL | Status: AC
  Filled 2022-07-10: qty 30

## 2022-07-10 MED ORDER — OMEPRAZOLE 20 MG PO CPDR: 20.0000 mg | DELAYED_RELEASE_CAPSULE | Freq: Every day | ORAL | 0 refills | Status: DC

## 2022-07-10 MED ORDER — ALUM & MAG HYDROXIDE-SIMETH 200-200-20 MG/5ML PO SUSP
30.0000 mL | Freq: Once | ORAL | Status: AC
  Administered 2022-07-10: 30 mL via ORAL

## 2022-07-10 MED ORDER — LIDOCAINE VISCOUS HCL 2 % MT SOLN
OROMUCOSAL | Status: AC
  Filled 2022-07-10: qty 15

## 2022-07-10 MED ORDER — LIDOCAINE VISCOUS HCL 2 % MT SOLN
15.0000 mL | Freq: Once | OROMUCOSAL | Status: AC
  Administered 2022-07-10: 15 mL via OROMUCOSAL

## 2022-07-10 NOTE — ED Provider Notes (Signed)
[facility]    CSN: [id number] Arrival date & time: 07/10/22  1117      History   Chief Complaint Chief Complaint  Patient presents with   Chest Pain    HPI Bradley Donaldson is a 52 y.o. male.   Patient here c/w L side chest pain x 1 - 2 weeks.  Waxes and wanes, lasts minutes then resolves.  He states deep inspirations make the pain worse (When he feels it, but it does not cause the pain).  No pain today.  He states pain is burning pain.  Nothing makes it better. He admits excess gas, bloating. He denies f/c, SOB, n/v, LE edema, leg pain. He has tried peptol w/o relief.  No injury, fall, or trauma.  Denies history of blood clots, HLD, DM, smoking . He does have HTN which is controlled with medication.      Past Medical History:  Diagnosis Date   ALLERGIC RHINITIS 03/18/2007   ANEMIA-IRON DEFICIENCY 03/18/2007   hx of   Anxiety    work related stress-   CROHN'S DISEASE 01/04/2007   DERMATITIS, SCALP 12/09/2007   GERD (gastroesophageal reflux disease)    OTC meds PRN   History of kidney stones    HYPERTENSION 01/04/2007   on meds   Incisional hernia 06/18/2010   Seasonal allergies    Sleep apnea    uses CPAP   URI 12/09/2007    Patient Active Problem List   Diagnosis Date Noted   Renal calculi 10/24/2021   Right renal stone 10/09/2021   Alpha thalassemia minor 08/18/2021   Anxiety 06/15/2020   Vitamin D deficiency 06/15/2020   B12 deficiency 06/15/2020   Low mean corpuscular volume (MCV) 06/15/2020   Fever 06/05/2019   Hyperglycemia 03/19/2017   Erectile dysfunction 02/27/2016   Left flank pain 04/09/2015   Skin nodule 04/09/2015   Microhematuria 04/09/2015   Itching 01/30/2014   Rash and nonspecific skin eruption 01/30/2014   OSA (obstructive sleep apnea) 08/28/2010   Encounter for well adult exam with abnormal findings 06/18/2010   Incisional hernia 06/18/2010   Goiter 06/18/2010   Seborrheic dermatitis of scalp 12/09/2007   Microcytic anemia  03/18/2007   ALLERGIC RHINITIS 03/18/2007   Essential hypertension 01/04/2007   Regional enteritis (HCC) 01/04/2007    Past Surgical History:  Procedure Laterality Date   COLONOSCOPY  2019   RG-prep exc   IR URETERAL STENT RIGHT NEW ACCESS W/O SEP NEPHROSTOMY CATH  10/24/2021   NEPHROLITHOTOMY Right 10/24/2021   Procedure: RIGHT NEPHROLITHOTOMY PERCUTANEOUS;  Surgeon: Crista Elliot, MD;  Location: [facility];  Service: Urology;  Laterality: Right;  2 HRS FOR THIS CASE   partial colon removal  1998   Right hemicolectomy       Home Medications    Prior to Admission medications   Medication Sig Start Date End Date Taking? Authorizing Provider  amLODipine-olmesartan (AZOR) 10-40 MG tablet Take 1 tablet by mouth daily. 02/20/22  Yes Corwin Levins, MD  omeprazole (PRILOSEC) 20 MG capsule Take 1 capsule (20 mg total) by mouth daily. 07/10/22  Yes Evern Core, PA-C  cyanocobalamin (,VITAMIN B-12,) 1000 MCG/ML injection Inject 1 mL (1,000 mcg total) into the muscle every 30 (thirty) days. 07/30/21   Lynann Bologna, MD  Fluocinolone Acetonide Scalp 0.01 % OIL Apply 1 Application topically every 7 (seven) days. 05/19/21   [provider]  hydrochlorothiazide (HYDRODIURIL) 25 MG tablet Take 1/2 (one-half) tablet by mouth once daily 08/05/21   Jonny Ruiz,  Len Blalock, MD  omeprazole (PRILOSEC) 20 MG capsule Take 1 capsule (20 mg total) by mouth daily. 07/29/21   Lynann Bologna, MD  tacrolimus (PROTOPIC) 0.03 % ointment Apply 1 Application topically daily.    [provider]  triamcinolone (KENALOG) 0.025 % ointment Apply 1 Application topically daily. 05/19/21   [provider]    Family History Family History  Problem Relation Age of Onset   Heart disease Father    Hypertension Other    Colon cancer Neg Hx    Esophageal cancer Neg Hx    Rectal cancer Neg Hx    Stomach cancer Neg Hx    Colon polyps Neg Hx     Social History Social History   Tobacco Use   Smoking  status: Never   Smokeless tobacco: Never  Vaping Use   Vaping Use: Never used  Substance Use Topics   Alcohol use: Yes    Alcohol/week: 0.0 - 1.0 standard drinks of alcohol    Comment: occas.   Drug use: No     Allergies   Ranitidine hcl   Review of Systems Review of Systems  Constitutional:  Negative for chills, fatigue and fever.  HENT:  Negative for congestion, ear pain, nosebleeds, postnasal drip, rhinorrhea, sinus pressure, sinus pain and sore throat.   Eyes:  Negative for pain and redness.  Respiratory:  Negative for cough, chest tightness, shortness of breath and wheezing.   Cardiovascular:  Negative for chest pain, palpitations and leg swelling.  Gastrointestinal:  Negative for abdominal pain, diarrhea, nausea and vomiting.  Musculoskeletal:  Positive for arthralgias. Negative for myalgias.  Skin:  Negative for rash.  Neurological:  Negative for light-headedness and headaches.  Hematological:  Negative for adenopathy. Does not bruise/bleed easily.  Psychiatric/Behavioral:  Negative for confusion and sleep disturbance.      Physical Exam Triage Vital Signs ED Triage Vitals  Enc Vitals Group     BP 07/10/22 1223 130/88     Pulse Rate 07/10/22 1223 84     Resp 07/10/22 1223 18     Temp 07/10/22 1223 98.3 F (36.8 C)     Temp Source 07/10/22 1223 Oral     SpO2 07/10/22 1223 99 %     Weight --      Height --      Head Circumference --      Peak Flow --      Pain Score 07/10/22 1230 5     Pain Loc --      Pain Edu? --      Excl. in GC? --    No data found.  Updated Vital Signs BP 130/88 (BP Location: Left Arm)   Pulse 84   Temp 98.3 F (36.8 C) (Oral)   Resp 18   SpO2 99%   Visual Acuity Right Eye Distance:   Left Eye Distance:   Bilateral Distance:    Right Eye Near:   Left Eye Near:    Bilateral Near:     Physical Exam Vitals and nursing note reviewed.  Constitutional:      General: He is not in acute distress.    Appearance: Normal  appearance. He is not ill-appearing.  HENT:     Head: Normocephalic and atraumatic.  Eyes:     General: No scleral icterus.    Extraocular Movements: Extraocular movements intact.     Conjunctiva/sclera: Conjunctivae normal.  Cardiovascular:     Rate and Rhythm: Normal rate and regular rhythm.  Heart sounds: No murmur heard. Pulmonary:     Effort: Pulmonary effort is normal. No respiratory distress.     Breath sounds: Normal breath sounds. No wheezing or rales.  Abdominal:     General: There is no distension.     Tenderness: There is no abdominal tenderness. There is no right CVA tenderness, left CVA tenderness, guarding or rebound.  Musculoskeletal:     Cervical back: Normal range of motion. No rigidity.     Right lower leg: No swelling or tenderness. No edema.     Left lower leg: No swelling or tenderness. No edema.  Lymphadenopathy:     Cervical: No cervical adenopathy.  Skin:    Coloration: Skin is not jaundiced.     Findings: No rash.  Neurological:     General: No focal deficit present.     Mental Status: He is alert and oriented to person, place, and time.     Motor: No weakness.     Gait: Gait normal.  Psychiatric:        Mood and Affect: Mood normal.        Behavior: Behavior normal.      UC Treatments / Results  Labs (all labs ordered are listed, but only abnormal results are displayed) Labs Reviewed - No data to display  EKG   Radiology No results found.  Procedures Procedures (including critical care time)  Medications Ordered in UC Medications  alum & mag hydroxide-simeth (MAALOX/MYLANTA) 200-200-20 MG/5ML suspension 30 mL (30 mLs Oral Given 07/10/22 1305)  lidocaine (XYLOCAINE) 2 % viscous mouth solution 15 mL (15 mLs Mouth/Throat Given 07/10/22 1306)    Initial Impression / Assessment and Plan / UC Course  I have reviewed the triage vital signs and the nursing notes.  Pertinent labs & imaging results that were available during my care of the  patient were reviewed by me and considered in my medical decision making (see chart for details).     EKG with single PVC, otherwise no acute changes Copy of EKG provided to patient, follow up with PCP Strict ED precautions provided Will trial omeprazole for possible GERD PERC negative Final Clinical Impressions(s) / UC Diagnoses   Final diagnoses:  Chest pain, unspecified type     Discharge Instructions      Take medication as prescribed Go to ED if you have worsening symptoms Follow up with PCP    ED Prescriptions     Medication Sig Dispense Auth. Provider   omeprazole (PRILOSEC) 20 MG capsule Take 1 capsule (20 mg total) by mouth daily. 30 capsule Evern Core, PA-C      PDMP not reviewed this encounter.   Evern Core, PA-C 07/10/22 1328

## 2022-07-10 NOTE — Discharge Instructions (Signed)
Take medication as prescribed Go to ED if you have worsening symptoms Follow up with PCP

## 2022-07-10 NOTE — ED Triage Notes (Signed)
Pt is here for chest discomfort x 1 week. Pt states the pain is not regular. Pt denies any trauma or falls to his chest.

## 2022-08-25 ENCOUNTER — Encounter: Payer: Self-pay | Admitting: Internal Medicine

## 2022-08-25 ENCOUNTER — Ambulatory Visit (INDEPENDENT_AMBULATORY_CARE_PROVIDER_SITE_OTHER): Payer: BC Managed Care – PPO | Admitting: Internal Medicine

## 2022-08-25 VITALS — BP 124/80 | HR 74 | Temp 98.4°F | Ht 64.0 in | Wt 183.0 lb

## 2022-08-25 DIAGNOSIS — E559 Vitamin D deficiency, unspecified: Secondary | ICD-10-CM

## 2022-08-25 DIAGNOSIS — E538 Deficiency of other specified B group vitamins: Secondary | ICD-10-CM | POA: Diagnosis not present

## 2022-08-25 DIAGNOSIS — R739 Hyperglycemia, unspecified: Secondary | ICD-10-CM | POA: Diagnosis not present

## 2022-08-25 DIAGNOSIS — R079 Chest pain, unspecified: Secondary | ICD-10-CM | POA: Diagnosis not present

## 2022-08-25 DIAGNOSIS — N529 Male erectile dysfunction, unspecified: Secondary | ICD-10-CM | POA: Diagnosis not present

## 2022-08-25 DIAGNOSIS — I10 Essential (primary) hypertension: Secondary | ICD-10-CM

## 2022-08-25 DIAGNOSIS — Z0001 Encounter for general adult medical examination with abnormal findings: Secondary | ICD-10-CM

## 2022-08-25 DIAGNOSIS — Z0000 Encounter for general adult medical examination without abnormal findings: Secondary | ICD-10-CM | POA: Diagnosis not present

## 2022-08-25 DIAGNOSIS — Z125 Encounter for screening for malignant neoplasm of prostate: Secondary | ICD-10-CM | POA: Diagnosis not present

## 2022-08-25 LAB — URINALYSIS, ROUTINE W REFLEX MICROSCOPIC
Bilirubin Urine: NEGATIVE
Hgb urine dipstick: NEGATIVE
Ketones, ur: NEGATIVE
Leukocytes,Ua: NEGATIVE
Nitrite: NEGATIVE
Specific Gravity, Urine: 1.015 (ref 1.000–1.030)
Total Protein, Urine: NEGATIVE
Urine Glucose: NEGATIVE
Urobilinogen, UA: 0.2 (ref 0.0–1.0)
pH: 6.5 (ref 5.0–8.0)

## 2022-08-25 LAB — CBC WITH DIFFERENTIAL/PLATELET
Basophils Absolute: 0.1 10*3/uL (ref 0.0–0.1)
Basophils Relative: 0.7 % (ref 0.0–3.0)
Eosinophils Absolute: 0.1 10*3/uL (ref 0.0–0.7)
Eosinophils Relative: 1.2 % (ref 0.0–5.0)
HCT: 41.5 % (ref 39.0–52.0)
Hemoglobin: 12.8 g/dL — ABNORMAL LOW (ref 13.0–17.0)
Lymphocytes Relative: 33.7 % (ref 12.0–46.0)
Lymphs Abs: 2.7 10*3/uL (ref 0.7–4.0)
MCHC: 30.9 g/dL (ref 30.0–36.0)
MCV: 68.2 fl — ABNORMAL LOW (ref 78.0–100.0)
Monocytes Absolute: 0.8 10*3/uL (ref 0.1–1.0)
Monocytes Relative: 9.9 % (ref 3.0–12.0)
Neutro Abs: 4.3 10*3/uL (ref 1.4–7.7)
Neutrophils Relative %: 54.5 % (ref 43.0–77.0)
Platelets: 401 10*3/uL — ABNORMAL HIGH (ref 150.0–400.0)
RBC: 6.07 Mil/uL — ABNORMAL HIGH (ref 4.22–5.81)
RDW: 16.8 % — ABNORMAL HIGH (ref 11.5–15.5)
WBC: 7.9 10*3/uL (ref 4.0–10.5)

## 2022-08-25 LAB — HEPATIC FUNCTION PANEL
ALT: 36 U/L (ref 0–53)
AST: 24 U/L (ref 0–37)
Albumin: 4.5 g/dL (ref 3.5–5.2)
Alkaline Phosphatase: 63 U/L (ref 39–117)
Bilirubin, Direct: 0.1 mg/dL (ref 0.0–0.3)
Total Bilirubin: 0.4 mg/dL (ref 0.2–1.2)
Total Protein: 8.5 g/dL — ABNORMAL HIGH (ref 6.0–8.3)

## 2022-08-25 LAB — PSA: PSA: 2.31 ng/mL (ref 0.10–4.00)

## 2022-08-25 LAB — LIPID PANEL
Cholesterol: 144 mg/dL (ref 0–200)
HDL: 36.5 mg/dL — ABNORMAL LOW (ref >39.00)
LDL Cholesterol: 80 mg/dL (ref 0–99)
NonHDL: 107.4
Total CHOL/HDL Ratio: 4
Triglycerides: 138 mg/dL (ref 0.0–149.0)
VLDL: 27.6 mg/dL (ref 0.0–40.0)

## 2022-08-25 LAB — BASIC METABOLIC PANEL
BUN: 13 mg/dL (ref 6–23)
CO2: 28 mEq/L (ref 19–32)
Calcium: 9.6 mg/dL (ref 8.4–10.5)
Chloride: 100 mEq/L (ref 96–112)
Creatinine, Ser: 1.38 mg/dL (ref 0.40–1.50)
GFR: 58.86 mL/min — ABNORMAL LOW (ref >60.00)
Glucose, Bld: 101 mg/dL — ABNORMAL HIGH (ref 70–99)
Potassium: 4.9 mEq/L (ref 3.5–5.1)
Sodium: 137 mEq/L (ref 135–145)

## 2022-08-25 LAB — TESTOSTERONE: Testosterone: 187.35 ng/dL — ABNORMAL LOW (ref 300.00–890.00)

## 2022-08-25 LAB — VITAMIN D 25 HYDROXY (VIT D DEFICIENCY, FRACTURES): VITD: 33.61 ng/mL (ref 30.00–100.00)

## 2022-08-25 LAB — VITAMIN B12: Vitamin B-12: 410 pg/mL (ref 211–911)

## 2022-08-25 LAB — HEMOGLOBIN A1C: Hgb A1c MFr Bld: 6.3 % (ref 4.6–6.5)

## 2022-08-25 LAB — TSH: TSH: 1.54 u[IU]/mL (ref 0.35–5.50)

## 2022-08-25 NOTE — Progress Notes (Signed)
Patient ID: Bradley Donaldson, male   DOB: 12-15-1970, 52 y.o.   MRN: [id number]         Chief Complaint:: wellness exam and chest pain, ED, htn, hyperglycemia, low vit d and b12       HPI:  Bradley Donaldson is a 52 y.o. male here for wellness exam, for hep c screen, o/w up to date                        Also Pt denies chest pain, increased sob or doe, wheezing, orthopnea, PND, increased LE swelling, palpitations, dizziness or syncope.  CP seems resolved after increase fluids with taking pills.  Did not take th PPI as per ED visit.  Does have worsening ED symptoms last 6 mo.  Asks for testosterone check and tx if low.   Pt denies polydipsia, polyuria, or new focal neuro s/s.    Pt denies fever, wt loss, night sweats, loss of appetite, or other constitutional symptoms     Wt Readings from Last 3 Encounters:  08/25/22 183 lb (83 kg)  11/21/21 183 lb 9.6 oz (83.3 kg)  10/24/21 184 lb 15.5 oz (83.9 kg)   BP Readings from Last 3 Encounters:  08/25/22 124/80  07/10/22 130/88  11/21/21 114/68   Immunization History  Administered Date(s) Administered   Influenza Split 01/18/2012   Influenza Whole 12/09/2007   Influenza,inj,Quad PF,6+ Mos 02/27/2016, 05/04/2018, 12/24/2018   PFIZER(Purple Top)SARS-COV-2 Vaccination 05/20/2019, 06/10/2019, 02/16/2020, 11/18/2020   Tdap 02/27/2016   Zoster Recombinat (Shingrix) 04/13/2022, 06/15/2022   Health Maintenance Due  Topic Date Due   Hepatitis C Screening  Never done      Past Medical History:  Diagnosis Date   ALLERGIC RHINITIS 03/18/2007   ANEMIA-IRON DEFICIENCY 03/18/2007   hx of   Anxiety    work related stress-   CROHN'S DISEASE 01/04/2007   DERMATITIS, SCALP 12/09/2007   GERD (gastroesophageal reflux disease)    OTC meds PRN   History of kidney stones    HYPERTENSION 01/04/2007   on meds   Incisional hernia 06/18/2010   Seasonal allergies    Sleep apnea    uses CPAP   URI 12/09/2007   Past Surgical History:  Procedure Laterality Date    COLONOSCOPY  2019   RG-prep exc   IR URETERAL STENT RIGHT NEW ACCESS W/O SEP NEPHROSTOMY CATH  10/24/2021   NEPHROLITHOTOMY Right 10/24/2021   Procedure: RIGHT NEPHROLITHOTOMY PERCUTANEOUS;  Surgeon: Crista Elliot, MD;  Location: [facility];  Service: Urology;  Laterality: Right;  2 HRS FOR THIS CASE   partial colon removal  1998   Right hemicolectomy    reports that he has never smoked. He has never used smokeless tobacco. He reports current alcohol use. He reports that he does not use drugs. family history includes Heart disease in his father; Hypertension in an other family member. Allergies  Allergen Reactions   Ranitidine Hcl Itching and Rash    Full body   Current Outpatient Medications on File Prior to Visit  Medication Sig Dispense Refill   amLODipine-olmesartan (AZOR) 10-40 MG tablet Take 1 tablet by mouth daily. 90 tablet 3   cyanocobalamin (,VITAMIN B-12,) 1000 MCG/ML injection Inject 1 mL (1,000 mcg total) into the muscle every 30 (thirty) days. 1 mL 11   Fluocinolone Acetonide Scalp 0.01 % OIL Apply 1 Application topically every 7 (seven) days.     hydrochlorothiazide (HYDRODIURIL) 25 MG tablet Take 1/2 (one-half) tablet by  mouth once daily 45 tablet 3   omeprazole (PRILOSEC) 20 MG capsule Take 1 capsule (20 mg total) by mouth daily. 90 capsule 3   omeprazole (PRILOSEC) 20 MG capsule Take 1 capsule (20 mg total) by mouth daily. 30 capsule 0   tacrolimus (PROTOPIC) 0.03 % ointment Apply 1 Application topically daily.     triamcinolone (KENALOG) 0.025 % ointment Apply 1 Application topically daily.     No current facility-administered medications on file prior to visit.        ROS:  All others reviewed and negative.  Objective        PE:  BP 124/80 (BP Location: Left Arm, Patient Position: Sitting, Cuff Size: Normal)   Pulse 74   Temp 98.4 F (36.9 C) (Oral)   Ht 5\' 4"  (1.626 m)   Wt 183 lb (83 kg)   SpO2 98%   BMI 31.41 kg/m                 Constitutional: Pt  appears in NAD               HENT: Head: NCAT.                Right Ear: External ear normal.                 Left Ear: External ear normal.                Eyes: . Pupils are equal, round, and reactive to light. Conjunctivae and EOM are normal               Nose: without d/c or deformity               Neck: Neck supple. Gross normal ROM               Cardiovascular: Normal rate and regular rhythm.                 Pulmonary/Chest: Effort normal and breath sounds without rales or wheezing.                Abd:  Soft, NT, ND, + BS, no organomegaly               Neurological: Pt is alert. At baseline orientation, motor grossly intact               Skin: Skin is warm. No rashes, no other new lesions, LE edema - none               Psychiatric: Pt behavior is normal without agitation   Micro: none  Cardiac tracings I have personally interpreted today:  none  Pertinent Radiological findings (summarize): none   Lab Results  Component Value Date   WBC 7.9 08/25/2022   HGB 12.8 (L) 08/25/2022   HCT 41.5 08/25/2022   PLT 401.0 (H) 08/25/2022   GLUCOSE 101 (H) 08/25/2022   CHOL 144 08/25/2022   TRIG 138.0 08/25/2022   HDL 36.50 (L) 08/25/2022   LDLDIRECT 76.6 02/21/2013   LDLCALC 80 08/25/2022   ALT 36 08/25/2022   AST 24 08/25/2022   NA 137 08/25/2022   K 4.9 08/25/2022   CL 100 08/25/2022   CREATININE 1.38 08/25/2022   BUN 13 08/25/2022   CO2 28 08/25/2022   TSH 1.54 08/25/2022   PSA 2.31 08/25/2022   INR 2.1 (H) 10/24/2021   HGBA1C 6.3 08/25/2022   Assessment/Plan:  Bradley Donaldson is a 52 y.o.  Black or African American [2] male with  has a past medical history of ALLERGIC RHINITIS (03/18/2007), ANEMIA-IRON DEFICIENCY (03/18/2007), Anxiety, CROHN'S DISEASE (01/04/2007), DERMATITIS, SCALP (12/09/2007), GERD (gastroesophageal reflux disease), History of kidney stones, HYPERTENSION (01/04/2007), Incisional hernia (06/18/2010), Seasonal allergies, Sleep apnea, and URI  (12/09/2007).  Encounter for well adult exam with abnormal findings Age and sex appropriate education and counseling updated with regular exercise and diet Referrals for preventative services - for hep c screen Immunizations addressed - none needed Smoking counseling  - none needed Evidence for depression or other mood disorder - none significant Most recent labs reviewed. I have personally reviewed and have noted: 1) the patient's medical and social history 2) The patient's current medications and supplements 3) The patient's height, weight, and BMI have been recorded in the chart   Essential hypertension BP Readings from Last 3 Encounters:  08/25/22 124/80  07/10/22 130/88  11/21/21 114/68   Stable, pt to continue medical treatment azor 10 40 mg every day, hct 12.5 qd   Erectile dysfunction With recent worsening, declines viagra prn but ok for testosterone level  Hyperglycemia Lab Results  Component Value Date   HGBA1C 6.3 08/25/2022   Stable, pt to continue current medical treatment  - diet, wt control   Vitamin D deficiency Last vitamin D Lab Results  Component Value Date   VD25OH 33.61 08/25/2022   Low, to start oral replacement   B12 deficiency Lab Results  Component Value Date   VITAMINB12 410 08/25/2022   Stable, cont IM replacement - b12 1000 mcg qd  Followup: Return in about 6 months (around 02/24/2023).  Oliver Barre, MD 08/29/2022 3:30 PM [facility]

## 2022-08-25 NOTE — Patient Instructions (Signed)

## 2022-08-26 ENCOUNTER — Encounter: Payer: Self-pay | Admitting: Internal Medicine

## 2022-08-26 DIAGNOSIS — E291 Testicular hypofunction: Secondary | ICD-10-CM

## 2022-08-26 MED ORDER — TESTOSTERONE CYPIONATE 200 MG/ML IM SOLN: 200.0000 mg | INTRAMUSCULAR | 1 refills | Status: DC

## 2022-08-26 NOTE — Addendum Note (Signed)
Addended by: Corwin Levins on: 08/26/2022 04:56 PM   Modules accepted: Orders

## 2022-08-28 ENCOUNTER — Telehealth: Payer: Self-pay | Admitting: Internal Medicine

## 2022-08-28 MED ORDER — BD PEN NEEDLE NANO U/F 32G X 4 MM MISC: 3 refills | Status: DC

## 2022-08-28 NOTE — Telephone Encounter (Signed)
Patient needs an rx for his needles for his testosterone injections - please send to [facility] on [address]

## 2022-08-28 NOTE — Telephone Encounter (Signed)
Ok done

## 2022-08-29 ENCOUNTER — Encounter: Payer: Self-pay | Admitting: Internal Medicine

## 2022-08-29 NOTE — Assessment & Plan Note (Signed)
Lab Results  Component Value Date   HGBA1C 6.3 08/25/2022   Stable, pt to continue current medical treatment  - diet, wt control

## 2022-08-29 NOTE — Assessment & Plan Note (Signed)
Lab Results  Component Value Date   VITAMINB12 410 08/25/2022   Stable, cont IM replacement - b12 1000 mcg qd

## 2022-08-29 NOTE — Assessment & Plan Note (Signed)
With recent worsening, declines viagra prn but ok for testosterone level

## 2022-08-29 NOTE — Assessment & Plan Note (Signed)
Last vitamin D Lab Results  Component Value Date   VD25OH 33.61 08/25/2022   Low, to start oral replacement

## 2022-08-29 NOTE — Assessment & Plan Note (Signed)
Age and sex appropriate education and counseling updated with regular exercise and diet Referrals for preventative services - for hep c screen Immunizations addressed - none needed Smoking counseling  - none needed Evidence for depression or other mood disorder - none significant Most recent labs reviewed. I have personally reviewed and have noted: 1) the patient's medical and social history 2) The patient's current medications and supplements 3) The patient's height, weight, and BMI have been recorded in the chart  

## 2022-08-29 NOTE — Assessment & Plan Note (Signed)
BP Readings from Last 3 Encounters:  08/25/22 124/80  07/10/22 130/88  11/21/21 114/68   Stable, pt to continue medical treatment azor 10 40 mg every day, hct 12.5 qd

## 2022-09-01 ENCOUNTER — Other Ambulatory Visit: Payer: Self-pay | Admitting: Internal Medicine

## 2022-09-01 ENCOUNTER — Other Ambulatory Visit: Payer: Self-pay

## 2022-09-15 ENCOUNTER — Other Ambulatory Visit: Payer: Self-pay | Admitting: Internal Medicine

## 2022-09-15 MED ORDER — BD PEN NEEDLE NANO U/F 32G X 4 MM MISC: 3 refills | Status: AC

## 2022-09-21 ENCOUNTER — Other Ambulatory Visit (INDEPENDENT_AMBULATORY_CARE_PROVIDER_SITE_OTHER): Payer: BC Managed Care – PPO

## 2022-09-21 DIAGNOSIS — E291 Testicular hypofunction: Secondary | ICD-10-CM | POA: Diagnosis not present

## 2022-09-21 LAB — CBC WITH DIFFERENTIAL/PLATELET
Basophils Absolute: 0.1 10*3/uL (ref 0.0–0.1)
Basophils Relative: 0.6 % (ref 0.0–3.0)
Eosinophils Absolute: 0.1 10*3/uL (ref 0.0–0.7)
Eosinophils Relative: 1.5 % (ref 0.0–5.0)
HCT: 39.9 % (ref 39.0–52.0)
Hemoglobin: 12.6 g/dL — ABNORMAL LOW (ref 13.0–17.0)
Lymphocytes Relative: 27.1 % (ref 12.0–46.0)
Lymphs Abs: 2.4 10*3/uL (ref 0.7–4.0)
MCHC: 31.6 g/dL (ref 30.0–36.0)
MCV: 69 fl — ABNORMAL LOW (ref 78.0–100.0)
Monocytes Absolute: 0.7 10*3/uL (ref 0.1–1.0)
Monocytes Relative: 7.5 % (ref 3.0–12.0)
Neutro Abs: 5.6 10*3/uL (ref 1.4–7.7)
Neutrophils Relative %: 63.3 % (ref 43.0–77.0)
Platelets: 393 10*3/uL (ref 150.0–400.0)
RBC: 5.78 Mil/uL (ref 4.22–5.81)
RDW: 17.4 % — ABNORMAL HIGH (ref 11.5–15.5)
WBC: 8.9 10*3/uL (ref 4.0–10.5)

## 2022-09-21 LAB — TESTOSTERONE: Testosterone: 403.01 ng/dL (ref 300.00–890.00)

## 2022-09-21 NOTE — Progress Notes (Signed)
The test results show that your current treatment is OK, as the tests are stable.  Please continue the same plan.  There is no other need for change of treatment or further evaluation based on these results, at this time.  thanks 

## 2022-09-23 DIAGNOSIS — G4733 Obstructive sleep apnea (adult) (pediatric): Secondary | ICD-10-CM | POA: Diagnosis not present

## 2022-10-14 ENCOUNTER — Other Ambulatory Visit: Payer: Self-pay | Admitting: Internal Medicine

## 2022-10-19 ENCOUNTER — Other Ambulatory Visit: Payer: Self-pay | Admitting: Gastroenterology

## 2022-10-19 DIAGNOSIS — E538 Deficiency of other specified B group vitamins: Secondary | ICD-10-CM

## 2022-10-19 DIAGNOSIS — D509 Iron deficiency anemia, unspecified: Secondary | ICD-10-CM

## 2022-11-22 NOTE — Progress Notes (Unsigned)
HPI M never smoker followed for OSA, complicated by OSA, HTN, Allergic Rhinitis, GERD, Goiter, Rash, Anxiety, Crohns Regional Enteritis, Kidney Stone,  HST 2012- AHI 25/ hr ======================================================   11/21/21- 51 yoM never smoker followed for OSA, complicated by OSA, HTN, Allergic Rhinitis, GERD, Goiter, Rash, Anxiety, Crohns Regional Enteritis, Kidney Stone,  CPAP auto 5-20/ [employer]  replacement ordered 07/17/21 Download compliance- 93%, AHI 2.4/ hr Body weight today-183 lbs Covid vax- Flu vax-declines Replacement CPAP machine is about 2 months old and working well.  Used every night.  Download reviewed.  Life is better off with CPAP. Discussed recent experience with kidney stone.  11/24/22-  52 yoM never smoker followed for OSA, complicated by OSA, HTN, Allergic Rhinitis, GERD, Goiter, Rash, Anxiety, Crohns Regional Enteritis, Kidney Stone,  CPAP auto 5-20/ [facility]  replacement ordered 07/17/21 Download compliance-100%, AHI 2.1/hr Body weight today-184 lbs Comfortable with CPAP using nasal pillows type mask.  Download reviewed.  No concerns. Seasonal flare of allergic rhinitis.  Mainly nasal congestion.  We discussed use of decongestants antihistamines and nasal steroid spray.   ROS-see HPI   + = positive Constitutional:    weight loss, night sweats, fevers, chills, fatigue, lassitude. HEENT:    headaches, difficulty swallowing, tooth/dental problems, sore throat,       sneezing, itching, ear ache, nasal congestion, post nasal drip, snoring CV:    chest pain, orthopnea, PND, swelling in lower extremities, anasarca,                                  dizziness, palpitations Resp:   shortness of breath with exertion or at rest.                productive cough,   non-productive cough, coughing up of blood.              change in color of mucus.  wheezing.   Skin:    rash or lesions. GI:  No-   heartburn, indigestion, abdominal pain, nausea, vomiting, diarrhea,                  change in bowel habits, loss of appetite GU: dysuria, change in color of urine, no urgency or frequency.   flank pain. MS:   joint pain, stiffness, decreased range of motion, back pain. Neuro-     nothing unusual Psych:  change in mood or affect.  depression or anxiety.   memory loss.   OBJ- Physical Exam General- Alert, Oriented, Affect-appropriate, Distress- none acute, +overweight Skin- rash-none, lesions- none, excoriation- none Lymphadenopathy- none Head- atraumatic            Eyes- Gross vision intact, PERRLA, conjunctivae and secretions clear            Ears- Hearing, canals-normal            Nose- Clear, no-Septal dev, mucus, polyps, erosion, perforation             Throat- Mallampati IV , mucosa clear , drainage- none, tonsils- atrophic, +teeth Neck- flexible , trachea midline, no stridor , thyroid nl, carotid no bruit Chest - symmetrical excursion , unlabored           Heart/CV- RRR , no murmur , no gallop  , no rub, nl s1 s2                           -  JVD- none , edema- none, stasis changes- none, varices- none           Lung- clear to P&A, wheeze- none, cough- none , dullness-none, rub- none           Chest wall-  Abd-  Br/ Gen/ Rectal- Not done, not indicated Extrem- cyanosis- none, clubbing, none, atrophy- none, strength- nl Neuro- grossly intact to observation

## 2022-11-24 ENCOUNTER — Encounter: Payer: Self-pay | Admitting: Internal Medicine

## 2022-11-24 ENCOUNTER — Ambulatory Visit: Payer: BC Managed Care – PPO | Admitting: Internal Medicine

## 2022-11-24 VITALS — BP 124/82 | HR 104 | Temp 97.8°F | Ht 64.0 in | Wt 184.2 lb

## 2022-11-24 DIAGNOSIS — G4733 Obstructive sleep apnea (adult) (pediatric): Secondary | ICD-10-CM | POA: Diagnosis not present

## 2022-11-24 DIAGNOSIS — J302 Other seasonal allergic rhinitis: Secondary | ICD-10-CM

## 2022-11-24 DIAGNOSIS — J3089 Other allergic rhinitis: Secondary | ICD-10-CM

## 2022-11-24 NOTE — Assessment & Plan Note (Signed)
Benefits from CPAP with good compliance and control Plan- continue auto 5-20 

## 2022-11-24 NOTE — Assessment & Plan Note (Signed)
Mild flare of nasal congestion with season change and wet weather. Plan-medications reviewed and options discussed.

## 2022-11-24 NOTE — Patient Instructions (Signed)
We can continue CPAP auto 5-20- you are doing fine with this. You can adjust the humidifier setting if you want a little moister air coming to you.  For allergic nasal stuffiness you might try asking for Sudafed at the pharmacy counter. You can continue regular use of flonase when needed, and an antihistamine like allegra, zyrtec or claritin.

## 2023-01-26 DIAGNOSIS — G4733 Obstructive sleep apnea (adult) (pediatric): Secondary | ICD-10-CM | POA: Diagnosis not present

## 2023-02-25 DIAGNOSIS — G4733 Obstructive sleep apnea (adult) (pediatric): Secondary | ICD-10-CM | POA: Diagnosis not present

## 2023-03-08 ENCOUNTER — Encounter: Payer: Self-pay | Admitting: Internal Medicine

## 2023-03-08 ENCOUNTER — Ambulatory Visit: Payer: BC Managed Care – PPO | Admitting: Internal Medicine

## 2023-03-08 VITALS — BP 120/72 | HR 80 | Temp 99.3°F | Ht 64.0 in | Wt 183.0 lb

## 2023-03-08 DIAGNOSIS — E538 Deficiency of other specified B group vitamins: Secondary | ICD-10-CM | POA: Diagnosis not present

## 2023-03-08 DIAGNOSIS — E559 Vitamin D deficiency, unspecified: Secondary | ICD-10-CM

## 2023-03-08 DIAGNOSIS — I10 Essential (primary) hypertension: Secondary | ICD-10-CM | POA: Diagnosis not present

## 2023-03-08 DIAGNOSIS — Z23 Encounter for immunization: Secondary | ICD-10-CM

## 2023-03-08 DIAGNOSIS — R739 Hyperglycemia, unspecified: Secondary | ICD-10-CM | POA: Diagnosis not present

## 2023-03-08 DIAGNOSIS — E291 Testicular hypofunction: Secondary | ICD-10-CM

## 2023-03-08 MED ORDER — TESTOSTERONE CYPIONATE 200 MG/ML IM SOLN: 200.0000 mg | INTRAMUSCULAR | 1 refills | Status: DC

## 2023-03-08 MED ORDER — VARDENAFIL HCL 20 MG PO TABS: 20.0000 mg | ORAL_TABLET | Freq: Every day | ORAL | 5 refills | Status: DC | PRN

## 2023-03-08 NOTE — Patient Instructions (Signed)
Please continue all other medications as before, and refills have been done if requested.  Please have the pharmacy call with any other refills you may need.  Please continue your efforts at being more active, low cholesterol diet, and weight control.  You are otherwise up to date with prevention measures today.  Please keep your appointments with your specialists as you may have planned  Please make an Appointment to return in 6 months, or sooner if needed, also with Lab Appointment for testing done 3-5 days before at the FIRST FLOOR Lab (so this is for TWO appointments - please see the scheduling desk as you leave)  

## 2023-03-08 NOTE — Progress Notes (Signed)
Patient ID: Bradley Donaldson, male   DOB: [birth year], 52 y.o.   MRN: [id number]        Chief Complaint: follow up htn, hyperglycemia, low vit d and b12       HPI:  Bradley Donaldson is a 52 y.o. male here overall doing ok,  Pt denies chest pain, increased sob or doe, wheezing, orthopnea, PND, increased LE swelling, palpitations, dizziness or syncope.   Pt denies polydipsia, polyuria, or new focal neuro s/s.    Pt denies fever, wt loss, night sweats, loss of appetite, or other constitutional symptoms         Wt Readings from Last 3 Encounters:  03/08/23 183 lb (83 kg)  11/24/22 184 lb 3.2 oz (83.6 kg)  08/25/22 183 lb (83 kg)   BP Readings from Last 3 Encounters:  03/08/23 120/72  11/24/22 124/82  08/25/22 124/80         Past Medical History:  Diagnosis Date   ALLERGIC RHINITIS 03/18/2007   ANEMIA-IRON DEFICIENCY 03/18/2007   hx of   Anxiety    work related stress-   CROHN'S DISEASE 01/04/2007   DERMATITIS, SCALP 12/09/2007   GERD (gastroesophageal reflux disease)    OTC meds PRN   History of kidney stones    HYPERTENSION 01/04/2007   on meds   Incisional hernia 06/18/2010   Seasonal allergies    Sleep apnea    uses CPAP   URI 12/09/2007   Past Surgical History:  Procedure Laterality Date   COLONOSCOPY  2019   RG-prep exc   IR URETERAL STENT RIGHT NEW ACCESS W/O SEP NEPHROSTOMY CATH  10/24/2021   NEPHROLITHOTOMY Right 10/24/2021   Procedure: RIGHT NEPHROLITHOTOMY PERCUTANEOUS;  Surgeon: Crista Elliot, MD;  Location: [facility];  Service: Urology;  Laterality: Right;  2 HRS FOR THIS CASE   partial colon removal  1998   Right hemicolectomy    reports that he has never smoked. He has never used smokeless tobacco. He reports current alcohol use. He reports that he does not use drugs. family history includes Heart disease in his father; Hypertension in an other family member. Allergies  Allergen Reactions   Ranitidine Hcl Itching and Rash    Full body   Current Outpatient  Medications on File Prior to Visit  Medication Sig Dispense Refill   amLODipine-olmesartan (AZOR) 10-40 MG tablet Take 1 tablet by mouth daily. 90 tablet 3   cyanocobalamin (VITAMIN B12) 1000 MCG/ML injection Inject 1 mL (1,000 mcg total) into the muscle every 30 (thirty) days. Please call [phone] to schedule an office visit for more refills 1 mL 0   Fluocinolone Acetonide Scalp 0.01 % OIL Apply 1 Application topically every 7 (seven) days.     hydrochlorothiazide (HYDRODIURIL) 25 MG tablet Take 1/2 (one-half) tablet by mouth once daily 45 tablet 2   Insulin Pen Needle (BD PEN NEEDLE NANO U/F) 32G X 4 MM MISC Use as directed once weekly 100 each 3   omeprazole (PRILOSEC) 20 MG capsule Take 1 capsule (20 mg total) by mouth daily. 90 capsule 3   omeprazole (PRILOSEC) 20 MG capsule Take 1 capsule (20 mg total) by mouth daily. 30 capsule 0   tacrolimus (PROTOPIC) 0.03 % ointment Apply 1 Application topically daily.     triamcinolone (KENALOG) 0.025 % ointment Apply 1 Application topically daily.     No current facility-administered medications on file prior to visit.        ROS:  All others reviewed and negative.  Objective        PE:  BP 120/72 (BP Location: Right Arm, Patient Position: Sitting, Cuff Size: Normal)   Pulse 80   Temp 99.3 F (37.4 C) (Oral)   Ht 5\' 4"  (1.626 m)   Wt 183 lb (83 kg)   SpO2 98%   BMI 31.41 kg/m                 Constitutional: Pt appears in NAD               HENT: Head: NCAT.                Right Ear: External ear normal.                 Left Ear: External ear normal.                Eyes: . Pupils are equal, round, and reactive to light. Conjunctivae and EOM are normal               Nose: without d/c or deformity               Neck: Neck supple. Gross normal ROM               Cardiovascular: Normal rate and regular rhythm.                 Pulmonary/Chest: Effort normal and breath sounds without rales or wheezing.                Abd:  Soft, NT, ND, +  BS, no organomegaly               Neurological: Pt is alert. At baseline orientation, motor grossly intact               Skin: Skin is warm. No rashes, no other new lesions, LE edema - none               Psychiatric: Pt behavior is normal without agitation   Micro: none  Cardiac tracings I have personally interpreted today:  none  Pertinent Radiological findings (summarize): none   Lab Results  Component Value Date   WBC 8.9 09/21/2022   HGB 12.6 (L) 09/21/2022   HCT 39.9 09/21/2022   PLT 393.0 09/21/2022   GLUCOSE 101 (H) 08/25/2022   CHOL 144 08/25/2022   TRIG 138.0 08/25/2022   HDL 36.50 (L) 08/25/2022   LDLDIRECT 76.6 02/21/2013   LDLCALC 80 08/25/2022   ALT 36 08/25/2022   AST 24 08/25/2022   NA 137 08/25/2022   K 4.9 08/25/2022   CL 100 08/25/2022   CREATININE 1.38 08/25/2022   BUN 13 08/25/2022   CO2 28 08/25/2022   TSH 1.54 08/25/2022   PSA 2.31 08/25/2022   INR 2.1 (H) 10/24/2021   HGBA1C 6.3 08/25/2022   Assessment/Plan:  Bradley Donaldson is a 52 y.o. Black or African American [2] male with  has a past medical history of ALLERGIC RHINITIS (03/18/2007), ANEMIA-IRON DEFICIENCY (03/18/2007), Anxiety, CROHN'S DISEASE (01/04/2007), DERMATITIS, SCALP (12/09/2007), GERD (gastroesophageal reflux disease), History of kidney stones, HYPERTENSION (01/04/2007), Incisional hernia (06/18/2010), Seasonal allergies, Sleep apnea, and URI (12/09/2007).  Essential hypertension BP Readings from Last 3 Encounters:  03/08/23 120/72  11/24/22 124/82  08/25/22 124/80   Stable, pt to continue medical treatment azor 10 40 every day, hct 12.5 qd   Hyperglycemia Lab Results  Component Value Date   HGBA1C 6.3 08/25/2022   Stable, pt  to continue current medical treatment  - diet, wt control   Vitamin D deficiency Last vitamin D Lab Results  Component Value Date   VD25OH 33.61 08/25/2022   Low, to start oral replacement   B12 deficiency Lab Results  Component Value Date    VITAMINB12 410 08/25/2022   Stable, no change in tx at this time  Followup: Return in about 6 months (around 09/06/2023).  Oliver Barre, MD 03/09/2023 7:31 PM [facility]

## 2023-03-09 ENCOUNTER — Encounter: Payer: Self-pay | Admitting: Internal Medicine

## 2023-03-09 NOTE — Assessment & Plan Note (Signed)
 BP Readings from Last 3 Encounters:  03/08/23 120/72  11/24/22 124/82  08/25/22 124/80   Stable, pt to continue medical treatment azor 10 40 every day, hct 12.5 qd

## 2023-03-09 NOTE — Assessment & Plan Note (Addendum)
 Lab Results  Component Value Date   VITAMINB12 410 08/25/2022   Stable, no change in tx at this time

## 2023-03-09 NOTE — Assessment & Plan Note (Signed)
Lab Results  Component Value Date   HGBA1C 6.3 08/25/2022   Stable, pt to continue current medical treatment  - diet, wt control

## 2023-03-09 NOTE — Assessment & Plan Note (Signed)
Last vitamin D Lab Results  Component Value Date   VD25OH 33.61 08/25/2022   Low, to start oral replacement

## 2023-03-14 ENCOUNTER — Other Ambulatory Visit: Payer: Self-pay | Admitting: Internal Medicine

## 2023-03-15 ENCOUNTER — Other Ambulatory Visit: Payer: Self-pay

## 2023-03-17 ENCOUNTER — Encounter: Payer: Self-pay | Admitting: Internal Medicine

## 2023-03-18 MED ORDER — WEGOVY 0.25 MG/0.5ML ~~LOC~~ SOAJ: 0.2500 mg | SUBCUTANEOUS | 11 refills | Status: DC

## 2023-03-28 DIAGNOSIS — G4733 Obstructive sleep apnea (adult) (pediatric): Secondary | ICD-10-CM | POA: Diagnosis not present

## 2023-04-01 ENCOUNTER — Other Ambulatory Visit (HOSPITAL_COMMUNITY): Payer: Self-pay

## 2023-04-14 ENCOUNTER — Telehealth: Payer: Self-pay

## 2023-04-14 ENCOUNTER — Other Ambulatory Visit (HOSPITAL_COMMUNITY): Payer: Self-pay

## 2023-04-14 NOTE — Telephone Encounter (Signed)
 Copied from CRM [id number]. Topic: Clinical - Prescription Issue >> Apr 14, 2023 12:08 PM Isabell A wrote: Reason for CRM: Sonley from [employer] calling in regard to prior authorization for Wegovy , needing additional information.  Callback number: [phone]

## 2023-04-14 NOTE — Telephone Encounter (Signed)
 Ok to forward to the office that handles the PA's process    thanks

## 2023-04-14 NOTE — Telephone Encounter (Signed)
 Called pt insurance about denial of Wegovy , they needing more information and records of pt trying some form of dieting weight loss program and exercise plan that has not work for pt. This questions was not answered during the PA process on question 34.  New PA is required

## 2023-04-14 NOTE — Telephone Encounter (Signed)
 Pharmacy Patient Advocate Encounter   Received notification from  [facility] Portal that prior authorization for Wegovy  0.25MG /0.5ML auto-injectors is required/requested.   Insurance verification completed.   The patient is insured through [facility]   .   Per test claim: PA required; PA submitted to above mentioned insurance via CoverMyMeds Key/confirmation #/EOC [id number] Status is pending

## 2023-04-15 ENCOUNTER — Other Ambulatory Visit (HOSPITAL_COMMUNITY): Payer: Self-pay

## 2023-04-19 ENCOUNTER — Other Ambulatory Visit (HOSPITAL_COMMUNITY): Payer: Self-pay

## 2023-05-07 ENCOUNTER — Other Ambulatory Visit (HOSPITAL_COMMUNITY): Payer: Self-pay

## 2023-05-07 NOTE — Telephone Encounter (Signed)
 Pharmacy Patient Advocate Encounter  Received notification from [facility]  that Prior Authorization for Wegovy 0.25MG /0.5ML auto-injectors has been APPROVED from 04/24/23 to 10/22/23. Unable to obtain price due to refill too soon rejection, last fill date 04/24/23 next available fill date03/08/25   PA #/Case ID/Reference #: [id number]

## 2023-05-22 ENCOUNTER — Other Ambulatory Visit: Payer: Self-pay | Admitting: Internal Medicine

## 2023-05-24 ENCOUNTER — Other Ambulatory Visit: Payer: Self-pay

## 2023-06-04 ENCOUNTER — Encounter: Payer: Self-pay | Admitting: Internal Medicine

## 2023-06-04 DIAGNOSIS — D509 Iron deficiency anemia, unspecified: Secondary | ICD-10-CM

## 2023-06-04 DIAGNOSIS — E538 Deficiency of other specified B group vitamins: Secondary | ICD-10-CM

## 2023-06-04 MED ORDER — WEGOVY 0.5 MG/0.5ML ~~LOC~~ SOAJ: 0.5000 mg | SUBCUTANEOUS | 11 refills | Status: DC

## 2023-06-04 MED ORDER — CYANOCOBALAMIN 1000 MCG/ML IJ SOLN
1000.0000 ug | INTRAMUSCULAR | 3 refills | Status: AC
Start: 2023-06-04 — End: ?

## 2023-07-19 ENCOUNTER — Encounter: Payer: Self-pay | Admitting: Internal Medicine

## 2023-07-19 MED ORDER — WEGOVY 1 MG/0.5ML ~~LOC~~ SOAJ: 1.0000 mg | SUBCUTANEOUS | 11 refills | Status: DC

## 2023-07-21 DIAGNOSIS — G4733 Obstructive sleep apnea (adult) (pediatric): Secondary | ICD-10-CM | POA: Diagnosis not present

## 2023-07-28 DIAGNOSIS — N200 Calculus of kidney: Secondary | ICD-10-CM | POA: Diagnosis not present

## 2023-07-30 ENCOUNTER — Telehealth: Payer: Self-pay | Admitting: Internal Medicine

## 2023-07-30 MED ORDER — ONDANSETRON 4 MG PO TBDP: 4.0000 mg | ORAL_TABLET | Freq: Three times a day (TID) | ORAL | 1 refills | Status: DC | PRN

## 2023-07-30 NOTE — Telephone Encounter (Signed)
 Called and let Pt know

## 2023-07-30 NOTE — Telephone Encounter (Signed)
 Ok I sent rx for zofran  prn nausea

## 2023-07-30 NOTE — Telephone Encounter (Signed)
 Copied from CRM [id number]. Topic: Clinical - Medication Question >> Jul 30, 2023  1:10 PM Adaysia C wrote: Reason for CRM: Patient is experiencing nausea side effect from Wegovy  injection and has asked if the PCP can prescribe him a medication to help with that; patient has requested a follow up call #[phone]

## 2023-08-18 ENCOUNTER — Telehealth: Payer: Self-pay | Admitting: Internal Medicine

## 2023-08-18 NOTE — Telephone Encounter (Signed)
 Copied from CRM [id number]. Topic: Clinical - Request for Lab/Test Order >> Aug 18, 2023 11:12 AM Allyne Areola wrote: Reason for CRM: Patient is scheduled for a 6 month f/u on 08/27/2023 and would like to know if Dr.John would like him to do labs prior to the appointment.  ---  Does pt need labs? TDW

## 2023-08-18 NOTE — Telephone Encounter (Signed)
 Oh yes, and labs are already ordered  - see order review tab

## 2023-08-19 NOTE — Telephone Encounter (Signed)
 Called and let Pt know

## 2023-08-20 ENCOUNTER — Ambulatory Visit: Payer: Self-pay | Admitting: Internal Medicine

## 2023-08-20 ENCOUNTER — Other Ambulatory Visit (INDEPENDENT_AMBULATORY_CARE_PROVIDER_SITE_OTHER)

## 2023-08-20 DIAGNOSIS — E291 Testicular hypofunction: Secondary | ICD-10-CM | POA: Diagnosis not present

## 2023-08-20 LAB — TESTOSTERONE: Testosterone: 399.14 ng/dL (ref 300.00–890.00)

## 2023-08-20 NOTE — Progress Notes (Signed)
 The test results show that your current treatment is OK, as the tests are stable.  Please continue the same plan.  There is no other need for change of treatment or further evaluation based on these results, at this time.  thanks

## 2023-08-23 ENCOUNTER — Other Ambulatory Visit: Payer: Self-pay | Admitting: Internal Medicine

## 2023-08-27 ENCOUNTER — Ambulatory Visit: Admitting: Internal Medicine

## 2023-08-27 ENCOUNTER — Ambulatory Visit: Payer: Self-pay | Admitting: Internal Medicine

## 2023-08-27 VITALS — BP 124/80 | HR 80 | Temp 98.4°F | Ht 64.0 in | Wt 169.4 lb

## 2023-08-27 DIAGNOSIS — Z0001 Encounter for general adult medical examination with abnormal findings: Secondary | ICD-10-CM

## 2023-08-27 DIAGNOSIS — E538 Deficiency of other specified B group vitamins: Secondary | ICD-10-CM

## 2023-08-27 DIAGNOSIS — Z1159 Encounter for screening for other viral diseases: Secondary | ICD-10-CM | POA: Diagnosis not present

## 2023-08-27 DIAGNOSIS — E66811 Obesity, class 1: Secondary | ICD-10-CM

## 2023-08-27 DIAGNOSIS — R739 Hyperglycemia, unspecified: Secondary | ICD-10-CM

## 2023-08-27 DIAGNOSIS — E291 Testicular hypofunction: Secondary | ICD-10-CM | POA: Diagnosis not present

## 2023-08-27 DIAGNOSIS — Z125 Encounter for screening for malignant neoplasm of prostate: Secondary | ICD-10-CM

## 2023-08-27 DIAGNOSIS — E559 Vitamin D deficiency, unspecified: Secondary | ICD-10-CM

## 2023-08-27 DIAGNOSIS — I10 Essential (primary) hypertension: Secondary | ICD-10-CM

## 2023-08-27 DIAGNOSIS — G4733 Obstructive sleep apnea (adult) (pediatric): Secondary | ICD-10-CM

## 2023-08-27 LAB — CBC WITH DIFFERENTIAL/PLATELET
Basophils Absolute: 0.1 10*3/uL (ref 0.0–0.1)
Basophils Relative: 0.7 % (ref 0.0–3.0)
Eosinophils Absolute: 0.1 10*3/uL (ref 0.0–0.7)
Eosinophils Relative: 1.2 % (ref 0.0–5.0)
HCT: 50.6 % (ref 39.0–52.0)
Hemoglobin: 15.9 g/dL (ref 13.0–17.0)
Lymphocytes Relative: 22 % (ref 12.0–46.0)
Lymphs Abs: 1.9 10*3/uL (ref 0.7–4.0)
MCHC: 31.4 g/dL (ref 30.0–36.0)
MCV: 67.6 fl — ABNORMAL LOW (ref 78.0–100.0)
Monocytes Absolute: 0.7 10*3/uL (ref 0.1–1.0)
Monocytes Relative: 8.5 % (ref 3.0–12.0)
Neutro Abs: 5.9 10*3/uL (ref 1.4–7.7)
Neutrophils Relative %: 67.6 % (ref 43.0–77.0)
Platelets: 378 10*3/uL (ref 150.0–400.0)
RBC: 7.48 Mil/uL — ABNORMAL HIGH (ref 4.22–5.81)
RDW: 18.3 % — ABNORMAL HIGH (ref 11.5–15.5)
WBC: 8.7 10*3/uL (ref 4.0–10.5)

## 2023-08-27 LAB — BASIC METABOLIC PANEL WITH GFR
BUN: 14 mg/dL (ref 6–23)
CO2: 25 meq/L (ref 19–32)
Calcium: 9.4 mg/dL (ref 8.4–10.5)
Chloride: 99 meq/L (ref 96–112)
Creatinine, Ser: 1.34 mg/dL (ref 0.40–1.50)
GFR: 60.54 mL/min (ref >60.00)
Glucose, Bld: 95 mg/dL (ref 70–99)
Potassium: 3.9 meq/L (ref 3.5–5.1)
Sodium: 135 meq/L (ref 135–145)

## 2023-08-27 LAB — VITAMIN B12: Vitamin B-12: 310 pg/mL (ref 211–911)

## 2023-08-27 LAB — LIPID PANEL
Cholesterol: 132 mg/dL (ref 0–200)
HDL: 37.4 mg/dL — ABNORMAL LOW (ref >39.00)
LDL Cholesterol: 78 mg/dL (ref 0–99)
NonHDL: 94.51
Total CHOL/HDL Ratio: 4
Triglycerides: 85 mg/dL (ref 0.0–149.0)
VLDL: 17 mg/dL (ref 0.0–40.0)

## 2023-08-27 LAB — HEPATIC FUNCTION PANEL
ALT: 18 U/L (ref 0–53)
AST: 18 U/L (ref 0–37)
Albumin: 4.4 g/dL (ref 3.5–5.2)
Alkaline Phosphatase: 47 U/L (ref 39–117)
Bilirubin, Direct: 0.2 mg/dL (ref 0.0–0.3)
Total Bilirubin: 0.8 mg/dL (ref 0.2–1.2)
Total Protein: 8 g/dL (ref 6.0–8.3)

## 2023-08-27 LAB — URINALYSIS, ROUTINE W REFLEX MICROSCOPIC
Bilirubin Urine: NEGATIVE
Ketones, ur: NEGATIVE
Leukocytes,Ua: NEGATIVE
Nitrite: NEGATIVE
Specific Gravity, Urine: 1.01 (ref 1.000–1.030)
Total Protein, Urine: NEGATIVE
Urine Glucose: NEGATIVE
Urobilinogen, UA: 0.2 (ref 0.0–1.0)
pH: 6 (ref 5.0–8.0)

## 2023-08-27 LAB — HEMOGLOBIN A1C: Hgb A1c MFr Bld: 5.7 % (ref 4.6–6.5)

## 2023-08-27 LAB — VITAMIN D 25 HYDROXY (VIT D DEFICIENCY, FRACTURES): VITD: 30.39 ng/mL (ref 30.00–100.00)

## 2023-08-27 LAB — PSA: PSA: 2.53 ng/mL (ref 0.10–4.00)

## 2023-08-27 LAB — TSH: TSH: 1.14 u[IU]/mL (ref 0.35–5.50)

## 2023-08-27 MED ORDER — TESTOSTERONE CYPIONATE 200 MG/ML IM SOLN: 200.0000 mg | INTRAMUSCULAR | 1 refills | Status: AC

## 2023-08-27 MED ORDER — HYDROCHLOROTHIAZIDE 25 MG PO TABS: 12.5000 mg | ORAL_TABLET | Freq: Every day | ORAL | 3 refills | Status: AC

## 2023-08-27 MED ORDER — VARDENAFIL HCL 20 MG PO TABS: 20.0000 mg | ORAL_TABLET | Freq: Every day | ORAL | 11 refills | Status: AC | PRN

## 2023-08-27 MED ORDER — AMLODIPINE-OLMESARTAN 10-40 MG PO TABS: 1.0000 | ORAL_TABLET | Freq: Every day | ORAL | 3 refills | Status: AC

## 2023-08-27 MED ORDER — WEGOVY 1.7 MG/0.75ML ~~LOC~~ SOAJ: 1.7000 mg | SUBCUTANEOUS | 3 refills | Status: DC

## 2023-08-27 NOTE — Progress Notes (Unsigned)
 Patient ID: Bradley Donaldson, male   DOB: 04/26/1970, 53 y.o.   MRN: [id number]         Chief Complaint:: wellness exam and No chief complaint on file.  ***       HPI:  Bradley Donaldson is a 53 y.o. male here for wellness exam                        Also has pulm appt aug 2025 for osa.     Wt Readings from Last 3 Encounters:  08/27/23 169 lb 6 oz (76.8 kg)  03/08/23 183 lb (83 kg)  11/24/22 184 lb 3.2 oz (83.6 kg)   BP Readings from Last 3 Encounters:  08/27/23 124/80  03/08/23 120/72  11/24/22 124/82   Immunization History  Administered Date(s) Administered   Influenza Split 01/18/2012   Influenza Whole 12/09/2007   Influenza, Seasonal, Injecte, Preservative Fre 03/08/2023   Influenza,inj,Quad PF,6+ Mos 02/27/2016, 05/04/2018, 12/24/2018   PFIZER(Purple Top)SARS-COV-2 Vaccination 05/20/2019, 06/10/2019, 02/16/2020, 11/18/2020   Tdap 02/27/2016   Zoster Recombinant(Shingrix) 04/13/2022, 06/15/2022   Health Maintenance Due  Topic Date Due   Hepatitis C Screening  Never done      Past Medical History:  Diagnosis Date   ALLERGIC RHINITIS 03/18/2007   ANEMIA-IRON  DEFICIENCY 03/18/2007   hx of   Anxiety    work related stress-   CROHN'S DISEASE 01/04/2007   DERMATITIS, SCALP 12/09/2007   GERD (gastroesophageal reflux disease)    OTC meds PRN   History of kidney stones    HYPERTENSION 01/04/2007   on meds   Incisional hernia 06/18/2010   Seasonal allergies    Sleep apnea    uses CPAP   URI 12/09/2007   Past Surgical History:  Procedure Laterality Date   COLONOSCOPY  2019   RG-prep exc   IR URETERAL STENT RIGHT NEW ACCESS W/O SEP NEPHROSTOMY CATH  10/24/2021   NEPHROLITHOTOMY Right 10/24/2021   Procedure: RIGHT NEPHROLITHOTOMY PERCUTANEOUS;  Surgeon: Samson Croak, MD;  Location: [facility];  Service: Urology;  Laterality: Right;  2 HRS FOR THIS CASE   partial colon removal  1998   Right hemicolectomy    reports that he has never smoked. He has never used smokeless  tobacco. He reports current alcohol use. He reports that he does not use drugs. family history includes Heart disease in his father; Hypertension in an other family member. Allergies  Allergen Reactions   Ranitidine Hcl Itching and Rash    Full body   Current Outpatient Medications on File Prior to Visit  Medication Sig Dispense Refill   amLODipine -olmesartan  (AZOR ) 10-40 MG tablet Take 1 tablet by mouth once daily 90 tablet 0   cyanocobalamin  (VITAMIN B12) 1000 MCG/ML injection Inject 1 mL (1,000 mcg total) into the muscle every 30 (thirty) days. 3 mL 3   Fluocinolone  Acetonide Scalp 0.01 % OIL Apply 1 Application topically every 7 (seven) days.     hydrochlorothiazide  (HYDRODIURIL ) 25 MG tablet Take 1/2 (one-half) tablet by mouth once daily 45 tablet 2   Insulin Pen Needle (BD PEN NEEDLE NANO U/F) 32G X 4 MM MISC Use as directed once weekly 100 each 3   omeprazole  (PRILOSEC) 20 MG capsule Take 1 capsule (20 mg total) by mouth daily. 90 capsule 3   omeprazole  (PRILOSEC) 20 MG capsule Take 1 capsule (20 mg total) by mouth daily. 30 capsule 0   ondansetron  (ZOFRAN -ODT) 4 MG disintegrating tablet Take 1 tablet (4 mg  total) by mouth every 8 (eight) hours as needed. 30 tablet 1   Semaglutide -Weight Management (WEGOVY ) 1 MG/0.5ML SOAJ Inject 1 mg into the skin once a week. 2 mL 11   tacrolimus (PROTOPIC) 0.03 % ointment Apply 1 Application topically daily.     testosterone  cypionate (DEPOTESTOSTERONE CYPIONATE) 200 MG/ML injection Inject 1 mL (200 mg total) into the muscle every 14 (fourteen) days. 10 mL 1   triamcinolone (KENALOG) 0.025 % ointment Apply 1 Application topically daily.     vardenafil  (LEVITRA ) 20 MG tablet TAKE 1 TABLET BY MOUTH ONCE DAILY AS NEEDED FOR ERECTILE DYSFUNCTION 10 tablet 0   No current facility-administered medications on file prior to visit.        ROS:  All others reviewed and negative.  Objective        PE:  BP 124/80   Pulse 80   Temp 98.4 F (36.9 C)  (Temporal)   Ht 5' 4 (1.626 m)   Wt 169 lb 6 oz (76.8 kg)   SpO2 97%   BMI 29.07 kg/m                 Constitutional: Pt appears in NAD               HENT: Head: NCAT.                Right Ear: External ear normal.                 Left Ear: External ear normal.                Eyes: . Pupils are equal, round, and reactive to light. Conjunctivae and EOM are normal               Nose: without d/c or deformity               Neck: Neck supple. Gross normal ROM               Cardiovascular: Normal rate and regular rhythm.                 Pulmonary/Chest: Effort normal and breath sounds without rales or wheezing.                Abd:  Soft, NT, ND, + BS, no organomegaly               Neurological: Pt is alert. At baseline orientation, motor grossly intact               Skin: Skin is warm. No rashes, no other new lesions, LE edema - ***               Psychiatric: Pt behavior is normal without agitation   Micro: none  Cardiac tracings I have personally interpreted today:  none  Pertinent Radiological findings (summarize): none   Lab Results  Component Value Date   WBC 8.9 09/21/2022   HGB 12.6 (L) 09/21/2022   HCT 39.9 09/21/2022   PLT 393.0 09/21/2022   GLUCOSE 101 (H) 08/25/2022   CHOL 144 08/25/2022   TRIG 138.0 08/25/2022   HDL 36.50 (L) 08/25/2022   LDLDIRECT 76.6 02/21/2013   LDLCALC 80 08/25/2022   ALT 36 08/25/2022   AST 24 08/25/2022   NA 137 08/25/2022   K 4.9 08/25/2022   CL 100 08/25/2022   CREATININE 1.38 08/25/2022   BUN 13 08/25/2022   CO2 28 08/25/2022   TSH 1.54 08/25/2022  PSA 2.31 08/25/2022   INR 2.1 (H) 10/24/2021   HGBA1C 6.3 08/25/2022   Assessment/Plan:  Bradley Donaldson is a 53 y.o. Black or African American [2] male with  has a past medical history of ALLERGIC RHINITIS (03/18/2007), ANEMIA-IRON  DEFICIENCY (03/18/2007), Anxiety, CROHN'S DISEASE (01/04/2007), DERMATITIS, SCALP (12/09/2007), GERD (gastroesophageal reflux disease), History of kidney  stones, HYPERTENSION (01/04/2007), Incisional hernia (06/18/2010), Seasonal allergies, Sleep apnea, and URI (12/09/2007).  No problem-specific Assessment & Plan notes found for this encounter.  Followup: No follow-ups on file.  Rosalia Colonel, MD 08/27/2023 9:05 AM [facility]

## 2023-08-27 NOTE — Patient Instructions (Signed)
 Ok to increase the wegovy  to 1.7 mg weekly  Ok to decrease the B12 shots to every other month  Please continue all other medications as before, including the testosterone   Please have the pharmacy call with any other refills you may need.  Please continue your efforts at being more active, low cholesterol diet, and weight control.  You are otherwise up to date with prevention measures today.  Please keep your appointments with your specialists as you may have planned  Please go to the LAB at the blood drawing area for the tests to be done  You will be contacted by phone if any changes need to be made immediately.  Otherwise, you will receive a letter about your results with an explanation, but please check with MyChart first.  Please make an Appointment to return in 6 months, or sooner if needed, also with Lab Appointment for testing done 3-5 days before at the FIRST FLOOR Lab (so this is for TWO appointments - please see the scheduling desk as you leave)

## 2023-08-28 LAB — HEPATITIS C ANTIBODY: Hepatitis C Ab: NONREACTIVE

## 2023-08-29 ENCOUNTER — Encounter: Payer: Self-pay | Admitting: Internal Medicine

## 2023-08-29 DIAGNOSIS — E66811 Obesity, class 1: Secondary | ICD-10-CM | POA: Insufficient documentation

## 2023-08-29 DIAGNOSIS — E291 Testicular hypofunction: Secondary | ICD-10-CM | POA: Insufficient documentation

## 2023-08-29 NOTE — Assessment & Plan Note (Addendum)
 Lab Results  Component Value Date   VITAMINB12 310 08/27/2023   Stable, cont IM shots at home

## 2023-08-29 NOTE — Assessment & Plan Note (Signed)
 Ok for increased wegovy  1.7 mg weekly

## 2023-08-29 NOTE — Assessment & Plan Note (Signed)
 Stable, cont testosterone  replacement, f/u psa, hgb, testosterone  level

## 2023-08-29 NOTE — Assessment & Plan Note (Signed)

## 2023-08-29 NOTE — Assessment & Plan Note (Signed)
 BP Readings from Last 3 Encounters:  08/27/23 124/80  03/08/23 120/72  11/24/22 124/82   Stable, pt to continue medical treatment azor  10 40 every day, hct 12.5 qd

## 2023-08-29 NOTE — Assessment & Plan Note (Signed)
 Has f/u appt next wk

## 2023-08-29 NOTE — Assessment & Plan Note (Signed)
 Last vitamin D  Lab Results  Component Value Date   VD25OH 30.39 08/27/2023   Low, to start oral replacement

## 2023-08-29 NOTE — Assessment & Plan Note (Signed)
 Lab Results  Component Value Date   HGBA1C 5.7 08/27/2023   Stable, pt to continue current medical treatment  - diet, wt control

## 2023-08-31 ENCOUNTER — Ambulatory Visit: Payer: BC Managed Care – PPO | Admitting: Internal Medicine

## 2023-09-20 ENCOUNTER — Encounter: Payer: Self-pay | Admitting: Internal Medicine

## 2023-11-09 DIAGNOSIS — G4733 Obstructive sleep apnea (adult) (pediatric): Secondary | ICD-10-CM | POA: Diagnosis not present

## 2023-11-22 NOTE — Progress Notes (Signed)
 HPI M never smoker followed for OSA, complicated by OSA, HTN, Allergic Rhinitis, GERD, Goiter, Rash, Anxiety, Crohns Regional Enteritis, Kidney Stone,  HST 2012- AHI 25/ hr ======================================================    11/24/22-  52 yoM never smoker followed for OSA, complicated by OSA, HTN, Allergic Rhinitis, GERD, Goiter, Rash, Anxiety, Crohns Regional Enteritis, Kidney Stone,  CPAP auto 5-20/ [employer]  replacement ordered 07/17/21 Download compliance-100%, AHI 2.1/hr Body weight today-184 lbs Comfortable with CPAP using nasal pillows type mask.  Download reviewed.  No concerns. Seasonal flare of allergic rhinitis.  Mainly nasal congestion.  We discussed use of decongestants antihistamines and nasal steroid spray.  11/25/23- 53 yoM never smoker followed for OSA, complicated by OSA, HTN, Allergic Rhinitis, GERD, Goiter, Rash, Anxiety, Crohns Regional Enteritis, Kidney Stone,  CPAP auto 5-20/ [employer]  replacement ordered 07/17/21 Download compliance-80%, AHI 1.4/hr Body weight today-165 lbs Discussed the use of AI scribe software for clinical note transcription with the patient, who gave verbal consent to proceed.  History of Present Illness   Bradley Donaldson is a 53 year old male who presents for follow-up regarding CPAP use for sleep apnea.  He sleeps well and is accustomed to using his CPAP machine. Occasionally, he experiences short nights with limited sleep or removes the CPAP early. He feels more rested during the day since starting CPAP therapy.  His current CPAP machine was replaced two years ago and functions without issues. He is familiar with the type of machine he has. No new concerns or issues expressed.     Assessment and Plan:    Obstructive sleep apnea Obstructive sleep apnea well-managed with CPAP. Less than two apneas per hour. No issues with CPAP setup. - Continue CPAP therapy. - Plan CPAP machine replacement in three years.      ROS-see HPI   + =  positive Constitutional:    weight loss, night sweats, fevers, chills, fatigue, lassitude. HEENT:    headaches, difficulty swallowing, tooth/dental problems, sore throat,       sneezing, itching, ear ache, nasal congestion, post nasal drip, snoring CV:    chest pain, orthopnea, PND, swelling in lower extremities, anasarca,                                   dizziness, palpitations Resp:   shortness of breath with exertion or at rest.                productive cough,   non-productive cough, coughing up of blood.              change in color of mucus.  wheezing.   Skin:    rash or lesions. GI:  No-   heartburn, indigestion, abdominal pain, nausea, vomiting, diarrhea,                 change in bowel habits, loss of appetite GU: dysuria, change in color of urine, no urgency or frequency.   flank pain. MS:   joint pain, stiffness, decreased range of motion, back pain. Neuro-     nothing unusual Psych:  change in mood or affect.  depression or anxiety.   memory loss.   OBJ- Physical Exam General- Alert, Oriented, Affect-appropriate, Distress- none acute, +overweight Skin- rash-none, lesions- none, excoriation- none Lymphadenopathy- none Head- atraumatic            Eyes- Gross vision intact, PERRLA, conjunctivae and secretions clear  Ears- Hearing, canals-normal            Nose- Clear, no-Septal dev, mucus, polyps, erosion, perforation             Throat- Mallampati IV , mucosa clear , drainage- none, tonsils- atrophic, +teeth Neck- flexible , trachea midline, no stridor , thyroid  nl, carotid no bruit Chest - symmetrical excursion , unlabored           Heart/CV- RRR , no murmur , no gallop  , no rub, nl s1 s2                           - JVD- none , edema- none, stasis changes- none, varices- none           Lung- clear to P&A, wheeze- none, cough- none , dullness-none, rub- none           Chest wall-  Abd-  Br/ Gen/ Rectal- Not done, not indicated Extrem- cyanosis- none, clubbing,  none, atrophy- none, strength- nl Neuro- grossly intact to observation

## 2023-11-25 ENCOUNTER — Ambulatory Visit: Payer: BC Managed Care – PPO | Admitting: Internal Medicine

## 2023-11-25 ENCOUNTER — Encounter: Payer: Self-pay | Admitting: Internal Medicine

## 2023-11-25 VITALS — BP 122/74 | HR 84 | Temp 98.6°F | Ht 64.0 in | Wt 165.0 lb

## 2023-11-25 DIAGNOSIS — G4733 Obstructive sleep apnea (adult) (pediatric): Secondary | ICD-10-CM | POA: Diagnosis not present

## 2023-11-25 NOTE — Patient Instructions (Signed)
 Glad you are dong well. We can continue CPAP auto 5-20  Please let us  know if we can help.

## 2023-11-26 ENCOUNTER — Encounter: Payer: Self-pay | Admitting: Internal Medicine

## 2023-11-30 MED ORDER — WEGOVY 2.4 MG/0.75ML ~~LOC~~ SOAJ: 2.4000 mg | SUBCUTANEOUS | 11 refills | Status: AC

## 2024-01-10 ENCOUNTER — Other Ambulatory Visit (HOSPITAL_COMMUNITY): Payer: Self-pay

## 2024-01-10 ENCOUNTER — Telehealth: Payer: Self-pay | Admitting: Pharmacy Technician

## 2024-01-10 NOTE — Telephone Encounter (Signed)
 Pharmacy Patient Advocate Encounter   Received notification from CoverMyMeds that prior authorization for Testosterone  Cypionate 200MG /ML intramuscular solution  is required/requested.   Insurance verification completed.   The patient is insured through [facility].   Per test claim: PA required; PA submitted to above mentioned insurance via Latent Key/confirmation #/EOC [id number] Status is pending

## 2024-01-10 NOTE — Telephone Encounter (Signed)
 Pharmacy Patient Advocate Encounter  Received notification from [facility] that Prior Authorization for Testosterone  Cypionate 200MG /ML intramuscular solution  has been APPROVED from 01/10/24 to 01/10/27. Unable to obtain price due to refill too soon rejection, last fill date 01/10/24  next available fill date 03/17/24   PA #/Case ID/Reference #: [id number]

## 2024-01-14 ENCOUNTER — Other Ambulatory Visit (HOSPITAL_COMMUNITY): Payer: Self-pay

## 2024-02-22 ENCOUNTER — Other Ambulatory Visit

## 2024-02-22 DIAGNOSIS — E291 Testicular hypofunction: Secondary | ICD-10-CM | POA: Diagnosis not present

## 2024-02-22 DIAGNOSIS — E559 Vitamin D deficiency, unspecified: Secondary | ICD-10-CM

## 2024-02-22 DIAGNOSIS — I10 Essential (primary) hypertension: Secondary | ICD-10-CM | POA: Diagnosis not present

## 2024-02-22 DIAGNOSIS — E538 Deficiency of other specified B group vitamins: Secondary | ICD-10-CM

## 2024-02-22 DIAGNOSIS — R739 Hyperglycemia, unspecified: Secondary | ICD-10-CM

## 2024-02-22 LAB — HEMOGLOBIN A1C: Hgb A1c MFr Bld: 5.8 % (ref 4.6–6.5)

## 2024-02-22 LAB — VITAMIN D 25 HYDROXY (VIT D DEFICIENCY, FRACTURES): VITD: 17.83 ng/mL — ABNORMAL LOW (ref 30.00–100.00)

## 2024-02-22 LAB — BASIC METABOLIC PANEL WITH GFR
BUN: 11 mg/dL (ref 6–23)
CO2: 31 meq/L (ref 19–32)
Calcium: 9.7 mg/dL (ref 8.4–10.5)
Chloride: 97 meq/L (ref 96–112)
Creatinine, Ser: 1.38 mg/dL (ref 0.40–1.50)
GFR: 58.24 mL/min — ABNORMAL LOW (ref >60.00)
Glucose, Bld: 79 mg/dL (ref 70–99)
Potassium: 3.6 meq/L (ref 3.5–5.1)
Sodium: 136 meq/L (ref 135–145)

## 2024-02-22 LAB — LIPID PANEL
Cholesterol: 152 mg/dL (ref 28–200)
HDL: 45.7 mg/dL (ref >39.00)
LDL Cholesterol: 77 mg/dL (ref 10–99)
NonHDL: 105.97
Total CHOL/HDL Ratio: 3
Triglycerides: 146 mg/dL (ref 10.0–149.0)
VLDL: 29.2 mg/dL (ref 0.0–40.0)

## 2024-02-22 LAB — HEPATIC FUNCTION PANEL
ALT: 22 U/L (ref 3–53)
AST: 22 U/L (ref 5–37)
Albumin: 4.3 g/dL (ref 3.5–5.2)
Alkaline Phosphatase: 50 U/L (ref 39–117)
Bilirubin, Direct: 0.1 mg/dL (ref 0.1–0.3)
Total Bilirubin: 0.5 mg/dL (ref 0.2–1.2)
Total Protein: 8 g/dL (ref 6.0–8.3)

## 2024-02-22 LAB — TESTOSTERONE: Testosterone: 295.06 ng/dL — ABNORMAL LOW (ref 300.00–890.00)

## 2024-02-22 LAB — VITAMIN B12: Vitamin B-12: 305 pg/mL (ref 211–911)

## 2024-02-28 ENCOUNTER — Encounter: Payer: Self-pay | Admitting: Internal Medicine

## 2024-02-28 ENCOUNTER — Ambulatory Visit: Payer: Self-pay | Admitting: Internal Medicine

## 2024-02-28 VITALS — BP 118/80 | HR 96 | Temp 98.1°F | Ht 64.0 in | Wt 165.0 lb

## 2024-02-28 DIAGNOSIS — Z23 Encounter for immunization: Secondary | ICD-10-CM | POA: Diagnosis not present

## 2024-02-28 DIAGNOSIS — E538 Deficiency of other specified B group vitamins: Secondary | ICD-10-CM | POA: Diagnosis not present

## 2024-02-28 DIAGNOSIS — Z125 Encounter for screening for malignant neoplasm of prostate: Secondary | ICD-10-CM

## 2024-02-28 DIAGNOSIS — E66811 Obesity, class 1: Secondary | ICD-10-CM

## 2024-02-28 DIAGNOSIS — E291 Testicular hypofunction: Secondary | ICD-10-CM

## 2024-02-28 DIAGNOSIS — R739 Hyperglycemia, unspecified: Secondary | ICD-10-CM | POA: Diagnosis not present

## 2024-02-28 DIAGNOSIS — E559 Vitamin D deficiency, unspecified: Secondary | ICD-10-CM

## 2024-02-28 DIAGNOSIS — I10 Essential (primary) hypertension: Secondary | ICD-10-CM | POA: Diagnosis not present

## 2024-02-28 NOTE — Patient Instructions (Signed)
 You had the Prevnar 20 pneumonia shot today  Please take OTC Vitamin D3 at 2000 units per day, indefinitely  Please continue all other medications as before, and refills have been done if requested.  Please have the pharmacy call with any other refills you may need.  Please continue your efforts at being more active, low cholesterol diet, and weight control.  Please keep your appointments with your specialists as you may have planned  Please make an Appointment to return in 6 months, or sooner if needed, also with Lab Appointment for testing done 3-5 days before at the FIRST FLOOR Lab (so this is for TWO appointments - please see the scheduling desk as you leave)

## 2024-02-28 NOTE — Progress Notes (Signed)
 Patient ID: Bradley Donaldson, male   DOB: 06/05/1970, 53 y.o.   MRN: [id number]        Chief Complaint: follow up HTN, HLD and hyperglycemia , low vit d, ckd3a, low testosterone        HPI:  Bradley Donaldson is a 53 y.o. male here overall doing ok, Pt denies chest pain, increased sob or doe, wheezing, orthopnea, PND, increased LE swelling, palpitations, dizziness or syncope.   Pt denies polydipsia, polyuria, or new focal neuro s/s.    Pt denies fever, night sweats, loss of appetite, or other constitutional symptoms  Lost wt from 183 June 2024 to now 165.  Pt currently holding on androgen tx due to acne.Due for prevnar 20  Wt Readings from Last 3 Encounters:  02/28/24 165 lb (74.8 kg)  11/25/23 165 lb (74.8 kg)  08/27/23 169 lb 6 oz (76.8 kg)   BP Readings from Last 3 Encounters:  02/28/24 118/80  11/25/23 122/74  08/27/23 124/80         Past Medical History:  Diagnosis Date   ALLERGIC RHINITIS 03/18/2007   ANEMIA-IRON  DEFICIENCY 03/18/2007   hx of   Anxiety    work related stress-   CROHN'S DISEASE 01/04/2007   DERMATITIS, SCALP 12/09/2007   GERD (gastroesophageal reflux disease)    OTC meds PRN   History of kidney stones    HYPERTENSION 01/04/2007   on meds   Incisional hernia 06/18/2010   Seasonal allergies    Sleep apnea    uses CPAP   URI 12/09/2007   Past Surgical History:  Procedure Laterality Date   COLONOSCOPY  2019   RG-prep exc   IR URETERAL STENT RIGHT NEW ACCESS W/O SEP NEPHROSTOMY CATH  10/24/2021   NEPHROLITHOTOMY Right 10/24/2021   Procedure: RIGHT NEPHROLITHOTOMY PERCUTANEOUS;  Surgeon: Carolee Sherwood JONETTA DOUGLAS, MD;  Location: [facility];  Service: Urology;  Laterality: Right;  2 HRS FOR THIS CASE   partial colon removal  1998   Right hemicolectomy    reports that he has never smoked. He has never used smokeless tobacco. He reports current alcohol use. He reports that he does not use drugs. family history includes Heart disease in his father; Hypertension in an other  family member. Allergies[1] Medications Ordered Prior to Encounter[2]      ROS:  All others reviewed and negative.  Objective        PE:  BP 118/80 (BP Location: Left Arm, Patient Position: Sitting, Cuff Size: Normal)   Pulse 96   Temp 98.1 F (36.7 C) (Oral)   Ht 5' 4 (1.626 m)   Wt 165 lb (74.8 kg)   SpO2 98%   BMI 28.32 kg/m                 Constitutional: Pt appears in NAD               HENT: Head: NCAT.                Right Ear: External ear normal.                 Left Ear: External ear normal.                Eyes: . Pupils are equal, round, and reactive to light. Conjunctivae and EOM are normal               Nose: without d/c or deformity  Neck: Neck supple. Gross normal ROM               Cardiovascular: Normal rate and regular rhythm.                 Pulmonary/Chest: Effort normal and breath sounds without rales or wheezing.                Abd:  Soft, NT, ND, + BS, no organomegaly               Neurological: Pt is alert. At baseline orientation, motor grossly intact               Skin: Skin is warm. No rashes, no other new lesions, LE edema - none               Psychiatric: Pt behavior is normal without agitation   Micro: none  Cardiac tracings I have personally interpreted today:  none  Pertinent Radiological findings (summarize): none   Lab Results  Component Value Date   WBC 8.7 08/27/2023   HGB 15.9 08/27/2023   HCT 50.6 08/27/2023   PLT 378.0 08/27/2023   GLUCOSE 79 02/22/2024   CHOL 152 02/22/2024   TRIG 146.0 02/22/2024   HDL 45.70 02/22/2024   LDLDIRECT 76.6 02/21/2013   LDLCALC 77 02/22/2024   ALT 22 02/22/2024   AST 22 02/22/2024   NA 136 02/22/2024   K 3.6 02/22/2024   CL 97 02/22/2024   CREATININE 1.38 02/22/2024   BUN 11 02/22/2024   CO2 31 02/22/2024   TSH 1.14 08/27/2023   PSA 2.53 08/27/2023   INR 2.1 (H) 10/24/2021   HGBA1C 5.8 02/22/2024   Assessment/Plan:  Bradley Donaldson is a 53 y.o. Black or African American [2]  male with  has a past medical history of ALLERGIC RHINITIS (03/18/2007), ANEMIA-IRON  DEFICIENCY (03/18/2007), Anxiety, CROHN'S DISEASE (01/04/2007), DERMATITIS, SCALP (12/09/2007), GERD (gastroesophageal reflux disease), History of kidney stones, HYPERTENSION (01/04/2007), Incisional hernia (06/18/2010), Seasonal allergies, Sleep apnea, and URI (12/09/2007).  Vitamin D  deficiency Last vitamin D  Lab Results  Component Value Date   VD25OH 17.83 (L) 02/22/2024   Low, to start oral replacement   Obesity (BMI 30.0-34.9) Improved with diet and exercise,  to f/u any worsening symptoms or concerns  Hyperglycemia Lab Results  Component Value Date   HGBA1C 5.8 02/22/2024   Stable, pt to continue current medical treatment  - diet, wt control   Essential hypertension BP Readings from Last 3 Encounters:  02/28/24 118/80  11/25/23 122/74  08/27/23 124/80   Stable, pt to continue medical treatment azor  10 40 mg every day, hct 12.5 qd   B12 deficiency Lab Results  Component Value Date   VITAMINB12 305 02/22/2024   Stable, cont oral replacement - b12 1000 mcg qd  Followup: No follow-ups on file.  Lynwood Rush, MD 03/02/2024 9:06 PM [facility]     [1]  Allergies Allergen Reactions   Ranitidine Hcl Itching and Rash    Full body  [2]  Current Outpatient Medications on File Prior to Visit  Medication Sig Dispense Refill   amLODipine -olmesartan  (AZOR ) 10-40 MG tablet Take 1 tablet by mouth daily. 90 tablet 3   cyanocobalamin  (VITAMIN B12) 1000 MCG/ML injection Inject 1 mL (1,000 mcg total) into the muscle every 30 (thirty) days. 3 mL 3   hydrochlorothiazide  (HYDRODIURIL ) 25 MG tablet Take 0.5 tablets (12.5 mg total) by mouth daily. 45 tablet 3  Insulin Pen Needle (BD PEN NEEDLE NANO U/F) 32G X 4 MM MISC Use as directed once weekly 100 each 3   Potassium Citrate 15 MEQ (1620 MG) TBCR Take 1 tablet by mouth 3  (three) times daily.     semaglutide -weight management (WEGOVY ) 2.4 MG/0.75ML SOAJ SQ injection Inject 2.4 mg into the skin once a week. 3 mL 11   tacrolimus (PROTOPIC) 0.03 % ointment Apply 1 Application topically daily.     testosterone  cypionate (DEPOTESTOSTERONE CYPIONATE) 200 MG/ML injection Inject 1 mL (200 mg total) into the muscle every 14 (fourteen) days. 10 mL 1   vardenafil  (LEVITRA ) 20 MG tablet Take 1 tablet (20 mg total) by mouth daily as needed for erectile dysfunction. 10 tablet 11   No current facility-administered medications on file prior to visit.

## 2024-03-02 NOTE — Assessment & Plan Note (Signed)
 BP Readings from Last 3 Encounters:  02/28/24 118/80  11/25/23 122/74  08/27/23 124/80   Stable, pt to continue medical treatment azor  10 40 mg every day, hct 12.5 qd

## 2024-03-02 NOTE — Addendum Note (Signed)
 Addended by: NORLEEN LYNWOOD ORN on: 03/02/2024 09:09 PM   Modules accepted: Orders

## 2024-03-02 NOTE — Assessment & Plan Note (Signed)
 Lab Results  Component Value Date   HGBA1C 5.8 02/22/2024   Stable, pt to continue current medical treatment  - diet, wt control

## 2024-03-02 NOTE — Assessment & Plan Note (Signed)
 Last vitamin D  Lab Results  Component Value Date   VD25OH 17.83 (L) 02/22/2024   Low, to start oral replacement

## 2024-03-02 NOTE — Assessment & Plan Note (Signed)
 Lab Results  Component Value Date   VITAMINB12 305 02/22/2024   Stable, cont oral replacement - b12 1000 mcg qd

## 2024-03-02 NOTE — Assessment & Plan Note (Signed)
 Improved with diet and exercise,  to f/u any worsening symptoms or concerns
